# Patient Record
Sex: Female | Born: 1960 | Race: White | Hispanic: No | Smoking: Current every day smoker
Health system: Southern US, Community
[De-identification: ages and names within clinical notes are randomized; demographics above are authoritative.]

## PROBLEM LIST (undated history)

## (undated) DIAGNOSIS — R569 Unspecified convulsions: Secondary | ICD-10-CM

## (undated) DIAGNOSIS — F32A Depression, unspecified: Secondary | ICD-10-CM

## (undated) DIAGNOSIS — I10 Essential (primary) hypertension: Secondary | ICD-10-CM

## (undated) DIAGNOSIS — E119 Type 2 diabetes mellitus without complications: Secondary | ICD-10-CM

## (undated) DIAGNOSIS — F419 Anxiety disorder, unspecified: Secondary | ICD-10-CM

## (undated) DIAGNOSIS — F329 Major depressive disorder, single episode, unspecified: Secondary | ICD-10-CM

---

## 1999-09-07 ENCOUNTER — Ambulatory Visit (HOSPITAL_COMMUNITY): Admission: RE | Admit: 1999-09-07 | Discharge: 1999-09-07 | Payer: Self-pay | Admitting: Neurosurgery

## 1999-12-19 ENCOUNTER — Encounter: Admission: RE | Admit: 1999-12-19 | Discharge: 1999-12-19 | Payer: Self-pay | Admitting: Neurosurgery

## 1999-12-30 ENCOUNTER — Ambulatory Visit (HOSPITAL_COMMUNITY): Admission: RE | Admit: 1999-12-30 | Discharge: 1999-12-30 | Payer: Self-pay | Admitting: Neurosurgery

## 2000-01-04 ENCOUNTER — Ambulatory Visit (HOSPITAL_COMMUNITY): Admission: RE | Admit: 2000-01-04 | Discharge: 2000-01-04 | Payer: Self-pay | Admitting: Neurosurgery

## 2004-08-24 ENCOUNTER — Emergency Department: Payer: Self-pay | Admitting: Emergency Medicine

## 2006-03-22 ENCOUNTER — Ambulatory Visit: Payer: Self-pay | Admitting: Internal Medicine

## 2006-04-14 ENCOUNTER — Ambulatory Visit: Payer: Self-pay | Admitting: Gastroenterology

## 2006-12-31 ENCOUNTER — Emergency Department: Payer: Self-pay | Admitting: Emergency Medicine

## 2007-11-23 ENCOUNTER — Other Ambulatory Visit: Payer: Self-pay

## 2007-11-23 ENCOUNTER — Emergency Department: Payer: Self-pay | Admitting: Emergency Medicine

## 2008-03-26 ENCOUNTER — Other Ambulatory Visit: Payer: Self-pay

## 2008-03-26 ENCOUNTER — Ambulatory Visit: Payer: Self-pay | Admitting: Unknown Physician Specialty

## 2008-04-11 ENCOUNTER — Inpatient Hospital Stay: Payer: Self-pay | Admitting: Unknown Physician Specialty

## 2008-04-16 ENCOUNTER — Inpatient Hospital Stay: Payer: Self-pay | Admitting: Unknown Physician Specialty

## 2008-08-16 ENCOUNTER — Emergency Department: Payer: Self-pay | Admitting: Emergency Medicine

## 2008-08-18 ENCOUNTER — Emergency Department: Payer: Self-pay | Admitting: Unknown Physician Specialty

## 2008-10-22 ENCOUNTER — Emergency Department: Payer: Self-pay | Admitting: Emergency Medicine

## 2008-11-14 ENCOUNTER — Emergency Department: Payer: Self-pay | Admitting: Emergency Medicine

## 2008-12-01 ENCOUNTER — Inpatient Hospital Stay: Payer: Self-pay | Admitting: Internal Medicine

## 2009-04-10 ENCOUNTER — Emergency Department: Payer: Self-pay | Admitting: Emergency Medicine

## 2009-04-15 ENCOUNTER — Emergency Department (HOSPITAL_COMMUNITY): Admission: EM | Admit: 2009-04-15 | Discharge: 2009-04-16 | Payer: Self-pay | Admitting: Emergency Medicine

## 2009-05-02 ENCOUNTER — Emergency Department (HOSPITAL_COMMUNITY): Admission: EM | Admit: 2009-05-02 | Discharge: 2009-05-02 | Payer: Self-pay | Admitting: Emergency Medicine

## 2009-06-18 ENCOUNTER — Emergency Department (HOSPITAL_COMMUNITY): Admission: EM | Admit: 2009-06-18 | Discharge: 2009-06-18 | Payer: Self-pay | Admitting: Emergency Medicine

## 2009-07-26 ENCOUNTER — Inpatient Hospital Stay: Payer: Self-pay | Admitting: Internal Medicine

## 2009-09-12 ENCOUNTER — Emergency Department: Payer: Self-pay | Admitting: Emergency Medicine

## 2010-05-09 ENCOUNTER — Emergency Department: Payer: Self-pay | Admitting: Emergency Medicine

## 2010-05-28 ENCOUNTER — Emergency Department: Payer: Self-pay | Admitting: Emergency Medicine

## 2010-06-02 ENCOUNTER — Emergency Department: Payer: Self-pay | Admitting: Emergency Medicine

## 2010-06-09 ENCOUNTER — Emergency Department: Payer: Self-pay | Admitting: Emergency Medicine

## 2010-06-13 ENCOUNTER — Other Ambulatory Visit: Payer: Self-pay | Admitting: Internal Medicine

## 2010-06-16 ENCOUNTER — Ambulatory Visit: Payer: Self-pay | Admitting: Internal Medicine

## 2010-09-29 LAB — GLUCOSE, CAPILLARY

## 2010-10-02 LAB — COMPREHENSIVE METABOLIC PANEL
AST: 79 U/L — ABNORMAL HIGH (ref 0–37)
Albumin: 3.8 g/dL (ref 3.5–5.2)
BUN: 9 mg/dL (ref 6–23)
Calcium: 9.1 mg/dL (ref 8.4–10.5)
Creatinine, Ser: 0.94 mg/dL (ref 0.4–1.2)
GFR calc Af Amer: >60 mL/min (ref >60)
Total Protein: 7.5 g/dL (ref 6.0–8.3)

## 2010-10-02 LAB — URINALYSIS, ROUTINE W REFLEX MICROSCOPIC
Ketones, ur: NEGATIVE mg/dL
Leukocytes, UA: NEGATIVE
Nitrite: NEGATIVE
Protein, ur: NEGATIVE mg/dL
Urobilinogen, UA: 0.2 mg/dL (ref 0.0–1.0)
pH: 5.5 (ref 5.0–8.0)

## 2010-10-02 LAB — POCT I-STAT 3, ART BLOOD GAS (G3+)
TCO2: 24 mmol/L (ref 0–100)
pH, Arterial: 7.457 — ABNORMAL HIGH (ref 7.350–7.400)

## 2010-10-02 LAB — GLUCOSE, CAPILLARY

## 2010-10-02 LAB — DIFFERENTIAL
Basophils Relative: 0 % (ref 0–1)
Eosinophils Absolute: 0.1 10*3/uL (ref 0.0–0.7)
Eosinophils Relative: 1 % (ref 0–5)
Lymphocytes Relative: 16 % (ref 12–46)
Lymphs Abs: 1.3 10*3/uL (ref 0.7–4.0)
Monocytes Absolute: 0.7 10*3/uL (ref 0.1–1.0)
Neutro Abs: 6.5 10*3/uL (ref 1.7–7.7)

## 2010-10-02 LAB — POCT I-STAT, CHEM 8
Creatinine, Ser: 0.7 mg/dL (ref 0.4–1.2)
Hemoglobin: 15.3 g/dL — ABNORMAL HIGH (ref 12.0–15.0)
Sodium: 132 mEq/L — ABNORMAL LOW (ref 135–145)
TCO2: 23 mmol/L (ref 0–100)

## 2010-10-02 LAB — CBC
HCT: 41.9 % (ref 36.0–46.0)
MCHC: 35.3 g/dL (ref 30.0–36.0)
MCV: 100.9 fL — ABNORMAL HIGH (ref 78.0–100.0)
Platelets: 186 10*3/uL (ref 150–400)
RDW: 12.4 % (ref 11.5–15.5)
WBC: 8.6 10*3/uL (ref 4.0–10.5)

## 2010-10-02 LAB — LIPASE, BLOOD: Lipase: 320 U/L — ABNORMAL HIGH (ref 11–59)

## 2010-11-11 ENCOUNTER — Emergency Department: Payer: Self-pay | Admitting: Emergency Medicine

## 2010-11-15 ENCOUNTER — Emergency Department: Payer: Self-pay | Admitting: Emergency Medicine

## 2010-12-15 ENCOUNTER — Emergency Department: Payer: Self-pay | Admitting: Emergency Medicine

## 2010-12-19 ENCOUNTER — Emergency Department: Payer: Self-pay | Admitting: Emergency Medicine

## 2011-02-03 ENCOUNTER — Ambulatory Visit: Payer: Self-pay | Admitting: Pain Medicine

## 2011-02-05 ENCOUNTER — Ambulatory Visit: Payer: Self-pay | Admitting: Internal Medicine

## 2011-02-08 ENCOUNTER — Emergency Department: Payer: Self-pay | Admitting: Emergency Medicine

## 2011-02-11 ENCOUNTER — Emergency Department: Payer: Self-pay | Admitting: *Deleted

## 2011-02-12 ENCOUNTER — Ambulatory Visit: Payer: Self-pay | Admitting: Pain Medicine

## 2011-02-14 ENCOUNTER — Emergency Department: Payer: Self-pay | Admitting: *Deleted

## 2011-02-16 ENCOUNTER — Ambulatory Visit: Payer: Self-pay | Admitting: Pain Medicine

## 2011-02-17 ENCOUNTER — Emergency Department: Payer: Self-pay | Admitting: *Deleted

## 2011-02-23 ENCOUNTER — Ambulatory Visit: Payer: Self-pay | Admitting: Pain Medicine

## 2011-03-10 ENCOUNTER — Ambulatory Visit: Payer: Self-pay | Admitting: Pain Medicine

## 2011-03-16 ENCOUNTER — Ambulatory Visit: Payer: Self-pay | Admitting: Pain Medicine

## 2011-04-14 ENCOUNTER — Ambulatory Visit: Payer: Self-pay | Admitting: Pain Medicine

## 2011-04-22 ENCOUNTER — Ambulatory Visit: Payer: Self-pay | Admitting: Pain Medicine

## 2011-05-13 ENCOUNTER — Ambulatory Visit: Payer: Self-pay | Admitting: Internal Medicine

## 2011-06-02 ENCOUNTER — Ambulatory Visit: Payer: Self-pay | Admitting: Pain Medicine

## 2011-06-10 ENCOUNTER — Ambulatory Visit: Payer: Self-pay | Admitting: Pain Medicine

## 2011-06-25 ENCOUNTER — Emergency Department: Payer: Self-pay | Admitting: Emergency Medicine

## 2011-07-09 ENCOUNTER — Ambulatory Visit: Payer: Self-pay | Admitting: Pain Medicine

## 2011-07-15 ENCOUNTER — Ambulatory Visit: Payer: Self-pay | Admitting: Pain Medicine

## 2011-07-21 ENCOUNTER — Ambulatory Visit: Payer: Self-pay | Admitting: Pain Medicine

## 2011-07-27 ENCOUNTER — Ambulatory Visit: Payer: Self-pay | Admitting: Pain Medicine

## 2011-08-17 ENCOUNTER — Ambulatory Visit: Payer: Self-pay | Admitting: Pain Medicine

## 2011-08-20 ENCOUNTER — Ambulatory Visit: Payer: Self-pay | Admitting: Pain Medicine

## 2011-09-04 ENCOUNTER — Emergency Department: Payer: Self-pay | Admitting: Internal Medicine

## 2011-09-14 ENCOUNTER — Ambulatory Visit: Payer: Self-pay | Admitting: Pain Medicine

## 2011-10-06 ENCOUNTER — Ambulatory Visit: Payer: Self-pay | Admitting: Pain Medicine

## 2011-11-04 ENCOUNTER — Ambulatory Visit: Payer: Self-pay | Admitting: Pain Medicine

## 2011-11-11 ENCOUNTER — Ambulatory Visit: Payer: Self-pay | Admitting: Pain Medicine

## 2011-12-01 ENCOUNTER — Ambulatory Visit: Payer: Self-pay | Admitting: Pain Medicine

## 2011-12-14 ENCOUNTER — Ambulatory Visit: Payer: Self-pay | Admitting: Pain Medicine

## 2012-01-11 ENCOUNTER — Ambulatory Visit: Payer: Self-pay | Admitting: Pain Medicine

## 2012-02-11 ENCOUNTER — Ambulatory Visit: Payer: Self-pay | Admitting: Pain Medicine

## 2012-02-13 ENCOUNTER — Emergency Department: Payer: Self-pay | Admitting: Emergency Medicine

## 2012-02-22 ENCOUNTER — Ambulatory Visit: Payer: Self-pay | Admitting: Pain Medicine

## 2012-02-24 ENCOUNTER — Ambulatory Visit: Payer: Self-pay | Admitting: Pain Medicine

## 2012-02-25 ENCOUNTER — Ambulatory Visit: Payer: Self-pay | Admitting: Pain Medicine

## 2012-03-17 ENCOUNTER — Ambulatory Visit: Payer: Self-pay | Admitting: Pain Medicine

## 2012-07-10 ENCOUNTER — Encounter (HOSPITAL_COMMUNITY): Payer: Self-pay | Admitting: Emergency Medicine

## 2012-07-10 ENCOUNTER — Emergency Department (HOSPITAL_COMMUNITY): Payer: Medicaid Other

## 2012-07-10 ENCOUNTER — Emergency Department (HOSPITAL_COMMUNITY)
Admission: EM | Admit: 2012-07-10 | Discharge: 2012-07-10 | Disposition: A | Discharge status: Home / Self Care | Payer: Medicaid Other | Attending: Emergency Medicine | Admitting: Emergency Medicine

## 2012-07-10 DIAGNOSIS — R32 Unspecified urinary incontinence: Secondary | ICD-10-CM | POA: Insufficient documentation

## 2012-07-10 DIAGNOSIS — F17200 Nicotine dependence, unspecified, uncomplicated: Secondary | ICD-10-CM | POA: Insufficient documentation

## 2012-07-10 DIAGNOSIS — Z79899 Other long term (current) drug therapy: Secondary | ICD-10-CM | POA: Insufficient documentation

## 2012-07-10 DIAGNOSIS — E119 Type 2 diabetes mellitus without complications: Secondary | ICD-10-CM | POA: Insufficient documentation

## 2012-07-10 DIAGNOSIS — I10 Essential (primary) hypertension: Secondary | ICD-10-CM | POA: Insufficient documentation

## 2012-07-10 DIAGNOSIS — M545 Low back pain, unspecified: Secondary | ICD-10-CM | POA: Insufficient documentation

## 2012-07-10 DIAGNOSIS — L89309 Pressure ulcer of unspecified buttock, unspecified stage: Secondary | ICD-10-CM | POA: Insufficient documentation

## 2012-07-10 DIAGNOSIS — G8929 Other chronic pain: Secondary | ICD-10-CM | POA: Insufficient documentation

## 2012-07-10 DIAGNOSIS — F3289 Other specified depressive episodes: Secondary | ICD-10-CM | POA: Insufficient documentation

## 2012-07-10 DIAGNOSIS — R159 Full incontinence of feces: Secondary | ICD-10-CM

## 2012-07-10 DIAGNOSIS — Z7982 Long term (current) use of aspirin: Secondary | ICD-10-CM | POA: Insufficient documentation

## 2012-07-10 DIAGNOSIS — F411 Generalized anxiety disorder: Secondary | ICD-10-CM | POA: Insufficient documentation

## 2012-07-10 DIAGNOSIS — F329 Major depressive disorder, single episode, unspecified: Secondary | ICD-10-CM | POA: Insufficient documentation

## 2012-07-10 DIAGNOSIS — F1210 Cannabis abuse, uncomplicated: Secondary | ICD-10-CM | POA: Insufficient documentation

## 2012-07-10 HISTORY — DX: Depression, unspecified: F32.A

## 2012-07-10 HISTORY — DX: Anxiety disorder, unspecified: F41.9

## 2012-07-10 HISTORY — DX: Essential (primary) hypertension: I10

## 2012-07-10 HISTORY — DX: Type 2 diabetes mellitus without complications: E11.9

## 2012-07-10 HISTORY — DX: Major depressive disorder, single episode, unspecified: F32.9

## 2012-07-10 MED ORDER — OXYCODONE-ACETAMINOPHEN 5-325 MG PO TABS: ORAL_TABLET | ORAL | Status: DC

## 2012-07-10 MED ORDER — ONDANSETRON 4 MG PO TBDP
4.0000 mg | ORAL_TABLET | Freq: Once | ORAL | Status: AC
  Administered 2012-07-10: 4 mg via ORAL
  Filled 2012-07-10: qty 1

## 2012-07-10 MED ORDER — MORPHINE SULFATE 4 MG/ML IJ SOLN
4.0000 mg | Freq: Once | INTRAMUSCULAR | Status: AC
  Administered 2012-07-10: 4 mg via INTRAVENOUS
  Filled 2012-07-10: qty 1

## 2012-07-10 NOTE — ED Notes (Signed)
D/c instructions reviewed w/ pt and family - pt and family deny any further questions or concerns at present. Pt assisted to d/c via wheelchair by Erskine Squibb, EMT - pt A&Ox4 in no acute distress.

## 2012-07-10 NOTE — ED Provider Notes (Signed)
History   This chart was scribed for Wynetta Emery, PA, by Leone Payor, ED Scribe. This patient was seen in room TR06C/TR06C and the patient's care was started at 1558.   CSN: [id number]  Arrival date & time 07/10/12  1359   First MD Initiated Contact with Patient 07/10/12 1558      Chief Complaint  Patient presents with  . Back Pain     The history is provided by the patient. No language interpreter was used.    Debra Stone is a 52 y.o. female who presents to the Emergency Department complaining of chronic, ongoing severe back pain that started 2 years ago. Pt rates her current pain as 8/10.  Pt has h/o sciatica and 3 bulging discs. Pt used to go to a pain clinic but reports no longer going there because she has no relief from the pain injections. She states that change in bladder function started 6-7 months ago but states that she lost full control of bladder and bowel function starting 1 week ago. Pt denies fever, nausea, vomiting.   Pt has h/o DM, HTN, depression, anxiety. Pt is a current everyday smoker and occasional alcohol user.    Past Medical History  Diagnosis Date  . Diabetes mellitus without complication   . Hypertension   . Depression   . Anxiety     History reviewed. No pertinent past surgical history.  No family history on file.  History  Substance Use Topics  . Smoking status: Current Every Day Smoker  . Smokeless tobacco: Not on file  . Alcohol Use: Yes    No OB history provided.   Review of Systems  Constitutional: Negative for fever.  Respiratory: Negative for shortness of breath.   Cardiovascular: Negative for chest pain.  Gastrointestinal: Negative for nausea, vomiting, abdominal pain and diarrhea.  Musculoskeletal: Positive for back pain.  All other systems reviewed and are negative.    Allergies  Review of patient's allergies indicates not on file.  Home Medications  No current outpatient prescriptions on file.  BP 134/85   Pulse 87  Temp 98 F (36.7 C) (Oral)  Resp 16  SpO2 97%  Physical Exam  Nursing note and vitals reviewed. Constitutional: She is oriented to person, place, and time. She appears well-developed and well-nourished. No distress.  HENT:  Head: Normocephalic.  Mouth/Throat: Oropharynx is clear and moist.  Eyes: Conjunctivae normal and EOM are normal. Pupils are equal, round, and reactive to light.  Neck: Normal range of motion.  Cardiovascular: Normal rate, regular rhythm, normal heart sounds and intact distal pulses.   Pulmonary/Chest: Effort normal and breath sounds normal. No stridor.  Genitourinary:       External hemorrhoids, good rectal to home.  Musculoskeletal: Normal range of motion.  Neurological: She is alert and oriented to person, place, and time.       Sensation is normal.   Strength of the left lower extremity is 4/5. Patient states that this has been chronic for her.  Psychiatric: She has a normal mood and affect.    ED Course  Procedures (including critical care time)  DIAGNOSTIC STUDIES: Oxygen Saturation is 97% on room air, adequate by my interpretation.    COORDINATION OF CARE:  4:36 PM Discussed treatment plan which includes MRI with pt at bedside and pt agreed to plan.    Labs Reviewed - No data to display Mr Lumbar Spine Wo Contrast  07/10/2012  *RADIOLOGY REPORT*  Clinical Data: Low back pain.  Bladder  and bowel incontinence.  MRI LUMBAR SPINE WITHOUT CONTRAST  Technique:  Multiplanar and multiecho pulse sequences of the lumbar spine were obtained without intravenous contrast.  Comparison: Report of MRI dated 01/04/2000  Findings: Tip of the conus is at L1-2 and appears normal as does the distal thoracic spinal cord.  Normal paraspinal soft tissues.  T10-11 through L3-4:  Normal.  L4-5:  Small broad-based soft disc protrusion slightly asymmetric to the right slightly compressing the right L5 nerve root sleeve. The left L5 nerve root sleeve is also slightly  compressed.  The thecal sac terminates at the L4-5 level.  L5-S1:  Small annular tear and disc bulge central and to the right without neural impingement.  Minimal degenerative changes of the facet joints.  IMPRESSION: No acute abnormality of the lumbar spine.  Chronic degenerative disc disease at L4-5 and L5-S1.  By description on the prior report of 01/04/2000 there has been no significant change.   Original Report Authenticated By: Francene Boyers, M.D.      1. Chronic low back pain   2. Urinary and bowel incontinence   3. Pressure ulcer of buttock       MDM  Back pain and urinary and bowel incontinence. Case is discussed with attending Dr. Denton Lank. MRI ordered.   MRI shows no acute findings.  I personally performed the services described in this documentation, which was scribed in my presence. The recorded information has been reviewed and is accurate.    Pt verbalized understanding and agrees with care plan. Outpatient follow-up and return precautions given.     New Prescriptions   OXYCODONE-ACETAMINOPHEN (PERCOCET/ROXICET) 5-325 MG PER TABLET    1 to 2 tabs PO q6hrs  PRN for pain     Wynetta Emery, PA-C 07/11/12 1007

## 2012-07-10 NOTE — ED Notes (Signed)
Pt presents w/ chronic lower back pain, worse tonight and seeking pain management assistance. Pt in no acute distress, denies injuries and moves all extremities with out difficulty. Sandwich bag and soda given per pt request.

## 2012-07-10 NOTE — ED Notes (Signed)
Pt. Stated, My back pain is so bad i can't control it.  I can't control my bowels and urine to the point of wearing pull-ups.  Pt. Crying at triage wanting someone to help her feel better.

## 2012-07-10 NOTE — ED Notes (Signed)
Pt transported to MRI 

## 2012-07-10 NOTE — ED Notes (Signed)
Patient transported to MRI 

## 2012-07-10 NOTE — ED Notes (Addendum)
IV removed from rt ac. Bleeding controled 2x2 applied with tape.

## 2012-07-11 NOTE — ED Provider Notes (Signed)
Medical screening examination/treatment/procedure(s) were performed by non-physician practitioner and as supervising physician I was immediately available for consultation/collaboration.   Suzi Roots, MD 07/11/12 [phone]

## 2012-08-27 ENCOUNTER — Emergency Department: Payer: Self-pay | Admitting: Emergency Medicine

## 2012-08-27 LAB — URINALYSIS, COMPLETE
Bilirubin,UR: NEGATIVE
Blood: NEGATIVE
Nitrite: POSITIVE
Protein: 30
RBC,UR: NONE SEEN /HPF (ref 0–5)
Squamous Epithelial: 11
WBC UR: 21 /HPF (ref 0–5)

## 2012-08-27 LAB — DRUG SCREEN, URINE
Barbiturates, Ur Screen: NEGATIVE (ref ?–200)
Cocaine Metabolite,Ur ~~LOC~~: NEGATIVE (ref ?–300)
MDMA (Ecstasy)Ur Screen: NEGATIVE (ref ?–500)
Methadone, Ur Screen: NEGATIVE (ref ?–300)
Phencyclidine (PCP) Ur S: NEGATIVE (ref ?–25)

## 2012-08-27 LAB — COMPREHENSIVE METABOLIC PANEL
Bilirubin,Total: 0.4 mg/dL (ref 0.2–1.0)
Co2: 29 mmol/L (ref 21–32)
Creatinine: 0.96 mg/dL (ref 0.60–1.30)
SGOT(AST): 24 U/L (ref 15–37)
SGPT (ALT): 18 U/L (ref 12–78)
Total Protein: 7.8 g/dL (ref 6.4–8.2)

## 2012-08-27 LAB — CBC
Platelet: 243 10*3/uL (ref 150–440)
WBC: 10.5 10*3/uL (ref 3.6–11.0)

## 2012-08-27 LAB — ETHANOL: Ethanol %: <0.003 % (ref 0.000–0.080)

## 2012-09-25 ENCOUNTER — Emergency Department (HOSPITAL_COMMUNITY)
Admission: EM | Admit: 2012-09-25 | Discharge: 2012-09-25 | Disposition: A | Discharge status: Home / Self Care | Payer: Medicaid Other | Attending: Emergency Medicine | Admitting: Emergency Medicine

## 2012-09-25 ENCOUNTER — Emergency Department (HOSPITAL_COMMUNITY): Payer: Medicaid Other

## 2012-09-25 ENCOUNTER — Encounter (HOSPITAL_COMMUNITY): Payer: Self-pay | Admitting: *Deleted

## 2012-09-25 DIAGNOSIS — R892 Abnormal level of other drugs, medicaments and biological substances in specimens from other organs, systems and tissues: Secondary | ICD-10-CM | POA: Insufficient documentation

## 2012-09-25 DIAGNOSIS — E119 Type 2 diabetes mellitus without complications: Secondary | ICD-10-CM | POA: Insufficient documentation

## 2012-09-25 DIAGNOSIS — Y9389 Activity, other specified: Secondary | ICD-10-CM | POA: Insufficient documentation

## 2012-09-25 DIAGNOSIS — Y929 Unspecified place or not applicable: Secondary | ICD-10-CM | POA: Insufficient documentation

## 2012-09-25 DIAGNOSIS — G40909 Epilepsy, unspecified, not intractable, without status epilepticus: Secondary | ICD-10-CM | POA: Insufficient documentation

## 2012-09-25 DIAGNOSIS — IMO0002 Reserved for concepts with insufficient information to code with codable children: Secondary | ICD-10-CM | POA: Insufficient documentation

## 2012-09-25 DIAGNOSIS — F17200 Nicotine dependence, unspecified, uncomplicated: Secondary | ICD-10-CM | POA: Insufficient documentation

## 2012-09-25 DIAGNOSIS — S20212A Contusion of left front wall of thorax, initial encounter: Secondary | ICD-10-CM

## 2012-09-25 DIAGNOSIS — F411 Generalized anxiety disorder: Secondary | ICD-10-CM | POA: Insufficient documentation

## 2012-09-25 DIAGNOSIS — S40012A Contusion of left shoulder, initial encounter: Secondary | ICD-10-CM

## 2012-09-25 DIAGNOSIS — F329 Major depressive disorder, single episode, unspecified: Secondary | ICD-10-CM | POA: Insufficient documentation

## 2012-09-25 DIAGNOSIS — Z79899 Other long term (current) drug therapy: Secondary | ICD-10-CM | POA: Insufficient documentation

## 2012-09-25 DIAGNOSIS — R569 Unspecified convulsions: Secondary | ICD-10-CM

## 2012-09-25 DIAGNOSIS — S40019A Contusion of unspecified shoulder, initial encounter: Secondary | ICD-10-CM | POA: Insufficient documentation

## 2012-09-25 DIAGNOSIS — R7889 Finding of other specified substances, not normally found in blood: Secondary | ICD-10-CM

## 2012-09-25 DIAGNOSIS — R296 Repeated falls: Secondary | ICD-10-CM | POA: Insufficient documentation

## 2012-09-25 DIAGNOSIS — S20219A Contusion of unspecified front wall of thorax, initial encounter: Secondary | ICD-10-CM | POA: Insufficient documentation

## 2012-09-25 DIAGNOSIS — I10 Essential (primary) hypertension: Secondary | ICD-10-CM | POA: Insufficient documentation

## 2012-09-25 DIAGNOSIS — F3289 Other specified depressive episodes: Secondary | ICD-10-CM | POA: Insufficient documentation

## 2012-09-25 HISTORY — DX: Unspecified convulsions: R56.9

## 2012-09-25 LAB — GLUCOSE, CAPILLARY: Glucose-Capillary: 143 mg/dL — ABNORMAL HIGH (ref 70–99)

## 2012-09-25 LAB — COMPREHENSIVE METABOLIC PANEL
BUN: 11 mg/dL (ref 6–23)
CO2: 26 mEq/L (ref 19–32)
Calcium: 8.8 mg/dL (ref 8.4–10.5)
Chloride: 100 mEq/L (ref 96–112)
Creatinine, Ser: 0.88 mg/dL (ref 0.50–1.10)
GFR calc Af Amer: 87 mL/min — ABNORMAL LOW (ref >90)
GFR calc non Af Amer: 75 mL/min — ABNORMAL LOW (ref >90)
Glucose, Bld: 145 mg/dL — ABNORMAL HIGH (ref 70–99)
Total Bilirubin: 0.2 mg/dL — ABNORMAL LOW (ref 0.3–1.2)

## 2012-09-25 LAB — CBC
Hemoglobin: 13.5 g/dL (ref 12.0–15.0)
Platelets: 237 10*3/uL (ref 150–400)
RBC: 4.51 MIL/uL (ref 3.87–5.11)
WBC: 11.4 10*3/uL — ABNORMAL HIGH (ref 4.0–10.5)

## 2012-09-25 MED ORDER — PHENYTOIN SODIUM EXTENDED 100 MG PO CAPS: 300.0000 mg | ORAL_CAPSULE | Freq: Every day | ORAL | Status: AC

## 2012-09-25 MED ORDER — PHENYTOIN SODIUM EXTENDED 100 MG PO CAPS
300.0000 mg | ORAL_CAPSULE | Freq: Once | ORAL | Status: AC
  Administered 2012-09-25: 300 mg via ORAL
  Filled 2012-09-25: qty 3

## 2012-09-25 MED ORDER — OXYCODONE-ACETAMINOPHEN 5-325 MG PO TABS
2.0000 | ORAL_TABLET | Freq: Once | ORAL | Status: AC
  Administered 2012-09-25: 2 via ORAL
  Filled 2012-09-25: qty 2

## 2012-09-25 NOTE — ED Provider Notes (Signed)
History     CSN: [id number]  Arrival date & time 09/25/12  1220   First MD Initiated Contact with Patient 09/25/12 1337      Chief Complaint  Patient presents with  . Seizures    The history is provided by the patient.   the patient reports a history of epilepsy.  She's only on 100 mg of Dilantin daily.  She presents today complaining of severe left shoulder and chest pain after a seizure yesterday.  She reports that she had a typical seizure yesterday and fell to her left side.  She denies headache or neck pain.  She reports pain in her left shoulder with range of motion.  She also has pain in her left chest with palpation.  No shortness of breath.  No abdominal pain.  No nausea vomiting or diarrhea.  She's been compliant with her medications.  No fevers or chills.  No other complaints.  Her neurologist is in [facility].  Past Medical History  Diagnosis Date  . Diabetes mellitus without complication   . Hypertension   . Depression   . Anxiety   . Seizures     History reviewed. No pertinent past surgical history.  History reviewed. No pertinent family history.  History  Substance Use Topics  . Smoking status: Current Every Day Smoker  . Smokeless tobacco: Not on file  . Alcohol Use: Yes    OB History   Grav Para Term Preterm Abortions TAB SAB Ect Mult Living                  Review of Systems  Neurological: Positive for seizures.  All other systems reviewed and are negative.    Allergies  Vicodin  Home Medications   Current Outpatient Rx  Name  Route  Sig  Dispense  Refill  . albuterol (PROVENTIL HFA;VENTOLIN HFA) 108 (90 BASE) MCG/ACT inhaler   Inhalation   Inhale 1-2 puffs into the lungs every 4 (four) hours as needed for wheezing.         Marland Kitchen amLODipine (NORVASC) 10 MG tablet   Oral   Take 10 mg by mouth daily.         Marland Kitchen atenolol (TENORMIN) 100 MG tablet   Oral   Take 100 mg by mouth daily.         . budesonide-formoterol  (SYMBICORT) 160-4.5 MCG/ACT inhaler   Inhalation   Inhale 2 puffs into the lungs 2 (two) times daily.         . cholecalciferol (VITAMIN D) 1000 UNITS tablet   Oral   Take 2,000 Units by mouth daily.         . clonazePAM (KLONOPIN) 1 MG tablet   Oral   Take 1 mg by mouth 3 (three) times daily as needed for anxiety (nerves and anger outburst).         . cloNIDine (CATAPRES) 0.1 MG tablet   Oral   Take 0.1 mg by mouth 2 (two) times daily as needed. For hot flashes         . cyclobenzaprine (FLEXERIL) 10 MG tablet   Oral   Take 10 mg by mouth every 8 (eight) hours as needed. For muscle spasms         . DULoxetine (CYMBALTA) 30 MG capsule   Oral   Take 30 mg by mouth daily. For anxiety, depression         . Fluticasone-Salmeterol (ADVAIR) 250-50 MCG/DOSE AEPB   Inhalation  Inhale 2 puffs into the lungs daily.         Marland Kitchen lisinopril (PRINIVIL,ZESTRIL) 20 MG tablet   Oral   Take 40 mg by mouth daily.         . meclizine (ANTIVERT) 25 MG tablet   Oral   Take 25 mg by mouth 3 (three) times daily as needed for dizziness.         . metFORMIN (GLUCOPHAGE) 500 MG tablet   Oral   Take 1,000 mg by mouth 2 (two) times daily with a meal.         . omeprazole (PRILOSEC) 20 MG capsule   Oral   Take 20 mg by mouth daily.         . pravastatin (PRAVACHOL) 40 MG tablet   Oral   Take 40 mg by mouth daily.         . pregabalin (LYRICA) 100 MG capsule   Oral   Take 100 mg by mouth 2 (two) times daily.         Marland Kitchen rOPINIRole (REQUIP) 1 MG tablet   Oral   Take 1 mg by mouth at bedtime.         . saxagliptin HCl (ONGLYZA) 2.5 MG TABS tablet   Oral   Take 2.5 mg by mouth daily.         . simvastatin (ZOCOR) 20 MG tablet   Oral   Take 20 mg by mouth daily.         . phenytoin (DILANTIN) 100 MG ER capsule   Oral   Take 3 capsules (300 mg total) by mouth daily.   90 capsule   0     BP 158/82  Pulse 73  Temp(Src) 98.1 F (36.7 C) (Oral)  Resp  19  Ht 6' (1.829 m)  Wt 248 lb (112.492 kg)  BMI 33.63 kg/m2  SpO2 99%  Physical Exam  Nursing note and vitals reviewed. Constitutional: She is oriented to person, place, and time. She appears well-developed and well-nourished. No distress.  HENT:  Head: Normocephalic and atraumatic.  Eyes: EOM are normal. Pupils are equal, round, and reactive to light.  Neck: Normal range of motion.  Cardiovascular: Normal rate, regular rhythm and normal heart sounds.   Pulmonary/Chest: Effort normal and breath sounds normal.  Mild tenderness of left lateral chest wall.  Abdominal: Soft. She exhibits no distension. There is no tenderness.  Musculoskeletal: Normal range of motion.  Mild tenderness of left lateral deltoid without deformity.  Full range of motion of left shoulder.  Normal pulses in left radial artery  Neurological: She is alert and oriented to person, place, and time.  5/5 strength in major muscle groups of  bilateral upper and lower extremities. Speech normal. No facial asymetry.   Skin: Skin is warm and dry.  Psychiatric: She has a normal mood and affect. Judgment normal.    ED Course  Procedures (including critical care time)  Labs Reviewed  PHENYTOIN LEVEL, TOTAL - Abnormal; Notable for the following:    Phenytoin Lvl 3.8 (*)    All other components within normal limits  COMPREHENSIVE METABOLIC PANEL - Abnormal; Notable for the following:    Glucose, Bld 145 (*)    Total Bilirubin 0.2 (*)    GFR calc non Af Amer 75 (*)    GFR calc Af Amer 87 (*)    All other components within normal limits  CBC - Abnormal; Notable for the following:    WBC 11.4 (*)  All other components within normal limits  GLUCOSE, CAPILLARY - Abnormal; Notable for the following:    Glucose-Capillary 143 (*)    All other components within normal limits   Dg Chest 2 View  09/25/2012  *RADIOLOGY REPORT*  Clinical Data: Possible seizure  CHEST - 2 VIEW  Comparison: 06/18/2009  Findings: Normal heart  size and vascularity.  Lungs remain clear. No focal pneumonia, edema, effusion or pneumothorax.  Trachea midline.  Coronary stents noted.  Prior cholecystectomy evident.  IMPRESSION: No acute chest finding   Original Report Authenticated By: Judie Petit. Miles Costain, M.D.    Dg Shoulder Left  09/25/2012  *RADIOLOGY REPORT*  Clinical Data: Seizure, pain  LEFT SHOULDER - 2+ VIEW  Comparison: 09/25/2012  Findings: Normal alignment without fracture.  AC joint aligned as well without separation.  No acute osseous or joint abnormality. Left upper lobe clear.  Ribs appear intact.  IMPRESSION: No acute finding   Original Report Authenticated By: Judie Petit. Miles Costain, M.D.    I personally reviewed the imaging tests through PACS system I reviewed available ER/hospitalization records through the EMR   1. Seizure   2. Subtherapeutic serum dilantin level   3. Contusion of chest, left, initial encounter   4. Contusion of left shoulder, initial encounter       MDM  3:44 PM Basophils much better after pain medicine.  Seizure secondary to subtherapeutic Dilantin level.  Patient's been taking 100 mg daily.  We'll increase her to 300 mg daily.  Close neurology followup.  Instructions return to ER for new or worsening symptoms.  Chest and left shoulder contusions without radiographic abnormalities.  She has tramadol at home for pain.        Lyanne Co, MD 09/25/12 [phone]

## 2012-09-25 NOTE — ED Notes (Signed)
Pt states that in March she had a stroke and 3 seizures. Pt states she has been taking medicine but has had 2 seizures since. Pt states she came to ER because she hurt her left shoulder. No deformity, pt has full ROM.

## 2012-09-25 NOTE — ED Notes (Signed)
Pt reports having hx of seizures, had a witnessed seizure yesterday, reports taking her meds as prescribed. Reports falling during her seizure activity yesterday and now having left shoulder pain, no distress noted at triage.

## 2012-11-10 ENCOUNTER — Emergency Department: Payer: Self-pay | Admitting: Emergency Medicine

## 2012-11-10 LAB — URINALYSIS, COMPLETE
Bacteria: NONE SEEN
Bilirubin,UR: NEGATIVE
Blood: NEGATIVE
Glucose,UR: NEGATIVE mg/dL (ref 0–75)
Nitrite: NEGATIVE
WBC UR: 2 /HPF (ref 0–5)

## 2012-11-10 LAB — COMPREHENSIVE METABOLIC PANEL WITH GFR
Albumin: 3.7 g/dL
Alkaline Phosphatase: 108 U/L
Anion Gap: 6 — ABNORMAL LOW
BUN: 12 mg/dL
Bilirubin,Total: 0.3 mg/dL
Calcium, Total: 9 mg/dL
Chloride: 110 mmol/L — ABNORMAL HIGH
Co2: 25 mmol/L
Creatinine: 1.14 mg/dL
EGFR (African American): >60
EGFR (Non-African Amer.): 56 — ABNORMAL LOW
Glucose: 178 mg/dL — ABNORMAL HIGH
Osmolality: 285
Potassium: 4.1 mmol/L
SGOT(AST): 28 U/L
SGPT (ALT): 19 U/L
Sodium: 141 mmol/L
Total Protein: 7.7 g/dL

## 2012-11-10 LAB — CBC
HCT: 38.2 %
HGB: 13.3 g/dL
MCH: 32.6 pg
MCHC: 34.8 g/dL
MCV: 94 fL
Platelet: 292 x10 3/mm 3
RBC: 4.08 X10 6/mm 3
RDW: 15.6 % — ABNORMAL HIGH
WBC: 10.7 x10 3/mm 3

## 2012-11-10 LAB — ACETAMINOPHEN LEVEL: Acetaminophen: 3 ug/mL — ABNORMAL LOW

## 2012-11-10 LAB — DRUG SCREEN, URINE
Barbiturates, Ur Screen: POSITIVE (ref ?–200)
Cannabinoid 50 Ng, Ur ~~LOC~~: NEGATIVE (ref ?–50)
MDMA (Ecstasy)Ur Screen: NEGATIVE (ref ?–500)
Methadone, Ur Screen: NEGATIVE (ref ?–300)
Opiate, Ur Screen: NEGATIVE (ref ?–300)
Phencyclidine (PCP) Ur S: NEGATIVE (ref ?–25)

## 2012-11-10 LAB — ETHANOL
Ethanol %: <0.003 % (ref 0.000–0.080)
Ethanol: <3 mg/dL

## 2012-11-10 LAB — TSH: Thyroid Stimulating Horm: 1.53 u[IU]/mL

## 2012-11-12 ENCOUNTER — Ambulatory Visit: Payer: Self-pay | Admitting: Neurology

## 2012-11-12 LAB — CREATININE, SERUM
Creatinine: 1.11 mg/dL (ref 0.60–1.30)
EGFR (African American): >60
EGFR (Non-African Amer.): 58 — ABNORMAL LOW

## 2013-02-07 ENCOUNTER — Inpatient Hospital Stay: Payer: Self-pay | Admitting: Psychiatry

## 2013-02-07 LAB — COMPREHENSIVE METABOLIC PANEL
Alkaline Phosphatase: 100 U/L (ref 50–136)
Co2: 23 mmol/L (ref 21–32)
Creatinine: 1.18 mg/dL (ref 0.60–1.30)
EGFR (African American): >60
EGFR (Non-African Amer.): 54 — ABNORMAL LOW
Glucose: 180 mg/dL — ABNORMAL HIGH (ref 65–99)
Potassium: 4.5 mmol/L (ref 3.5–5.1)
Total Protein: 7.9 g/dL (ref 6.4–8.2)

## 2013-02-07 LAB — CBC
MCV: 93 fL (ref 80–100)
RDW: 13.7 % (ref 11.5–14.5)
WBC: 11.2 10*3/uL — ABNORMAL HIGH (ref 3.6–11.0)

## 2013-02-07 LAB — URINALYSIS, COMPLETE
Bilirubin,UR: NEGATIVE
Blood: NEGATIVE
Glucose,UR: 50 mg/dL (ref 0–75)
Leukocyte Esterase: NEGATIVE
Ph: 5 (ref 4.5–8.0)
RBC,UR: NONE SEEN /HPF (ref 0–5)
WBC UR: 1 /HPF (ref 0–5)

## 2013-02-07 LAB — SALICYLATE LEVEL: Salicylates, Serum: 3.3 mg/dL — ABNORMAL HIGH

## 2013-02-07 LAB — ETHANOL: Ethanol %: <0.003 % (ref 0.000–0.080)

## 2013-02-07 LAB — DRUG SCREEN, URINE
Barbiturates, Ur Screen: POSITIVE (ref ?–200)
Cannabinoid 50 Ng, Ur ~~LOC~~: NEGATIVE (ref ?–50)
MDMA (Ecstasy)Ur Screen: NEGATIVE (ref ?–500)
Methadone, Ur Screen: NEGATIVE (ref ?–300)
Opiate, Ur Screen: NEGATIVE (ref ?–300)

## 2013-02-07 LAB — PHENYTOIN LEVEL, TOTAL: Dilantin: 1.9 ug/mL — ABNORMAL LOW (ref 10.0–20.0)

## 2013-02-07 LAB — ACETAMINOPHEN LEVEL: Acetaminophen: <2 ug/mL

## 2013-05-23 ENCOUNTER — Emergency Department: Payer: Self-pay | Admitting: Emergency Medicine

## 2013-05-23 LAB — DRUG SCREEN, URINE
Barbiturates, Ur Screen: POSITIVE (ref ?–200)
Cannabinoid 50 Ng, Ur ~~LOC~~: NEGATIVE (ref ?–50)
Tricyclic, Ur Screen: NEGATIVE (ref ?–1000)

## 2013-05-23 LAB — COMPREHENSIVE METABOLIC PANEL
Albumin: 3.9 g/dL (ref 3.4–5.0)
Anion Gap: 6 — ABNORMAL LOW (ref 7–16)
Chloride: 109 mmol/L — ABNORMAL HIGH (ref 98–107)
Co2: 22 mmol/L (ref 21–32)
Creatinine: 1.39 mg/dL — ABNORMAL HIGH (ref 0.60–1.30)
EGFR (African American): 51 — ABNORMAL LOW
Glucose: 168 mg/dL — ABNORMAL HIGH (ref 65–99)
SGOT(AST): 34 U/L (ref 15–37)

## 2013-05-23 LAB — URINALYSIS, COMPLETE
Bilirubin,UR: NEGATIVE
Blood: NEGATIVE
Glucose,UR: NEGATIVE mg/dL (ref 0–75)
Ketone: NEGATIVE
Ph: 5 (ref 4.5–8.0)
Protein: NEGATIVE
RBC,UR: NONE SEEN /HPF (ref 0–5)

## 2013-05-23 LAB — CBC
MCV: 92 fL (ref 80–100)
RBC: 4.2 10*6/uL (ref 3.80–5.20)
RDW: 14.5 % (ref 11.5–14.5)

## 2013-05-23 LAB — ETHANOL: Ethanol: <3 mg/dL

## 2013-08-13 ENCOUNTER — Emergency Department: Payer: Self-pay | Admitting: Emergency Medicine

## 2013-08-13 LAB — BASIC METABOLIC PANEL
Anion Gap: 14 (ref 7–16)
BUN: 12 mg/dL (ref 7–18)
CREATININE: 0.89 mg/dL (ref 0.60–1.30)
Calcium, Total: 8.9 mg/dL (ref 8.5–10.1)
Chloride: 110 mmol/L — ABNORMAL HIGH (ref 98–107)
Co2: 15 mmol/L — ABNORMAL LOW (ref 21–32)
EGFR (African American): >60
EGFR (Non-African Amer.): >60
GLUCOSE: 242 mg/dL — AB (ref 65–99)
Osmolality: 285 (ref 275–301)
POTASSIUM: 3.6 mmol/L (ref 3.5–5.1)
Sodium: 139 mmol/L (ref 136–145)

## 2013-08-13 LAB — CBC
HCT: 40 % (ref 35.0–47.0)
HGB: 13.1 g/dL (ref 12.0–16.0)
MCH: 30.7 pg (ref 26.0–34.0)
MCHC: 32.6 g/dL (ref 32.0–36.0)
MCV: 94 fL (ref 80–100)
Platelet: 281 10*3/uL (ref 150–440)
RBC: 4.26 10*6/uL (ref 3.80–5.20)
RDW: 13.6 % (ref 11.5–14.5)
WBC: 9.8 10*3/uL (ref 3.6–11.0)

## 2013-08-13 LAB — ETHANOL
ETHANOL LVL: 112 mg/dL
Ethanol %: 0.112 % — ABNORMAL HIGH (ref 0.000–0.080)

## 2013-08-13 LAB — TROPONIN I

## 2013-12-31 ENCOUNTER — Emergency Department: Payer: Self-pay | Admitting: Emergency Medicine

## 2013-12-31 LAB — URINALYSIS, COMPLETE
BLOOD: NEGATIVE
Bilirubin,UR: NEGATIVE
GLUCOSE, UR: NEGATIVE mg/dL (ref 0–75)
KETONE: NEGATIVE
LEUKOCYTE ESTERASE: NEGATIVE
Nitrite: NEGATIVE
PH: 5 (ref 4.5–8.0)
Protein: NEGATIVE
Specific Gravity: 1.008 (ref 1.003–1.030)

## 2013-12-31 LAB — COMPREHENSIVE METABOLIC PANEL
ALBUMIN: 3.3 g/dL — AB (ref 3.4–5.0)
ALK PHOS: 131 U/L — AB
Anion Gap: 10 (ref 7–16)
BUN: 28 mg/dL — ABNORMAL HIGH (ref 7–18)
Bilirubin,Total: 0.2 mg/dL (ref 0.2–1.0)
CO2: 20 mmol/L — AB (ref 21–32)
Calcium, Total: 7.8 mg/dL — ABNORMAL LOW (ref 8.5–10.1)
Chloride: 98 mmol/L (ref 98–107)
Creatinine: 1.34 mg/dL — ABNORMAL HIGH (ref 0.60–1.30)
EGFR (African American): 53 — ABNORMAL LOW
GFR CALC NON AF AMER: 46 — AB
Glucose: 119 mg/dL — ABNORMAL HIGH (ref 65–99)
Osmolality: 264 (ref 275–301)
Potassium: 4.6 mmol/L (ref 3.5–5.1)
SGOT(AST): 41 U/L — ABNORMAL HIGH (ref 15–37)
SGPT (ALT): 20 U/L (ref 12–78)
Sodium: 128 mmol/L — ABNORMAL LOW (ref 136–145)
Total Protein: 7.3 g/dL (ref 6.4–8.2)

## 2013-12-31 LAB — DRUG SCREEN, URINE
AMPHETAMINES, UR SCREEN: NEGATIVE (ref ?–1000)
Barbiturates, Ur Screen: POSITIVE (ref ?–200)
Benzodiazepine, Ur Scrn: POSITIVE (ref ?–200)
CANNABINOID 50 NG, UR ~~LOC~~: NEGATIVE (ref ?–50)
Cocaine Metabolite,Ur ~~LOC~~: NEGATIVE (ref ?–300)
MDMA (Ecstasy)Ur Screen: NEGATIVE (ref ?–500)
METHADONE, UR SCREEN: NEGATIVE (ref ?–300)
Opiate, Ur Screen: NEGATIVE (ref ?–300)
Phencyclidine (PCP) Ur S: NEGATIVE (ref ?–25)
Tricyclic, Ur Screen: NEGATIVE (ref ?–1000)

## 2013-12-31 LAB — TROPONIN I: Troponin-I: <0.02 ng/mL

## 2013-12-31 LAB — CBC WITH DIFFERENTIAL/PLATELET
Basophil #: 0.1 10*3/uL (ref 0.0–0.1)
Basophil %: 0.3 %
Eosinophil #: 0.2 10*3/uL (ref 0.0–0.7)
Eosinophil %: 1 %
HCT: 36.6 % (ref 35.0–47.0)
HGB: 12.1 g/dL (ref 12.0–16.0)
Lymphocyte #: 2.8 10*3/uL (ref 1.0–3.6)
Lymphocyte %: 16.2 %
MCH: 31 pg (ref 26.0–34.0)
MCHC: 33 g/dL (ref 32.0–36.0)
MCV: 94 fL (ref 80–100)
Monocyte #: 1 x10 3/mm — ABNORMAL HIGH (ref 0.2–0.9)
Monocyte %: 5.7 %
Neutrophil #: 13.3 10*3/uL — ABNORMAL HIGH (ref 1.4–6.5)
Neutrophil %: 76.8 %
Platelet: 255 10*3/uL (ref 150–440)
RBC: 3.9 10*6/uL (ref 3.80–5.20)
RDW: 14 % (ref 11.5–14.5)
WBC: 17.3 10*3/uL — ABNORMAL HIGH (ref 3.6–11.0)

## 2014-01-12 ENCOUNTER — Inpatient Hospital Stay: Payer: Self-pay | Admitting: Internal Medicine

## 2014-01-12 LAB — CBC WITH DIFFERENTIAL/PLATELET
BASOS ABS: 0.2 10*3/uL — AB (ref 0.0–0.1)
BASOS PCT: 0.8 %
EOS PCT: 0.9 %
Eosinophil #: 0.2 10*3/uL (ref 0.0–0.7)
HCT: 50.9 % — ABNORMAL HIGH (ref 35.0–47.0)
HGB: 16.3 g/dL — ABNORMAL HIGH (ref 12.0–16.0)
LYMPHS ABS: 5.8 10*3/uL — AB (ref 1.0–3.6)
Lymphocyte %: 22.7 %
MCH: 31 pg (ref 26.0–34.0)
MCHC: 32 g/dL (ref 32.0–36.0)
MCV: 97 fL (ref 80–100)
Monocyte #: 0.9 x10 3/mm (ref 0.2–0.9)
Monocyte %: 3.7 %
NEUTROS ABS: 18.3 10*3/uL — AB (ref 1.4–6.5)
Neutrophil %: 71.9 %
Platelet: 386 10*3/uL (ref 150–440)
RBC: 5.24 10*6/uL — ABNORMAL HIGH (ref 3.80–5.20)
RDW: 14.3 % (ref 11.5–14.5)
WBC: 25.5 10*3/uL — ABNORMAL HIGH (ref 3.6–11.0)

## 2014-01-12 LAB — PROTIME-INR
INR: 1.5
Prothrombin Time: 17.8 secs — ABNORMAL HIGH (ref 11.5–14.7)

## 2014-01-12 LAB — COMPREHENSIVE METABOLIC PANEL
ALK PHOS: 133 U/L — AB
ANION GAP: 14 (ref 7–16)
Albumin: 3.8 g/dL (ref 3.4–5.0)
BUN: 19 mg/dL — ABNORMAL HIGH (ref 7–18)
Bilirubin,Total: 0.4 mg/dL (ref 0.2–1.0)
CALCIUM: 9.5 mg/dL (ref 8.5–10.1)
Chloride: 102 mmol/L (ref 98–107)
Co2: 17 mmol/L — ABNORMAL LOW (ref 21–32)
Creatinine: 1.69 mg/dL — ABNORMAL HIGH (ref 0.60–1.30)
EGFR (African American): 40 — ABNORMAL LOW
GFR CALC NON AF AMER: 35 — AB
Glucose: 229 mg/dL — ABNORMAL HIGH (ref 65–99)
Osmolality: 276 (ref 275–301)
POTASSIUM: 3.2 mmol/L — AB (ref 3.5–5.1)
SGOT(AST): 50 U/L — ABNORMAL HIGH (ref 15–37)
SGPT (ALT): 22 U/L (ref 12–78)
Sodium: 133 mmol/L — ABNORMAL LOW (ref 136–145)
TOTAL PROTEIN: 8.3 g/dL — AB (ref 6.4–8.2)

## 2014-01-12 LAB — CK TOTAL AND CKMB (NOT AT ARMC)
CK, TOTAL: 171 U/L
CK-MB: 2.5 ng/mL (ref 0.5–3.6)

## 2014-01-12 LAB — TROPONIN I: Troponin-I: <0.02 ng/mL

## 2014-01-12 LAB — LIPASE, BLOOD: Lipase: 415 U/L — ABNORMAL HIGH (ref 73–393)

## 2014-01-13 LAB — CBC WITH DIFFERENTIAL/PLATELET
BASOS PCT: 0.5 %
Basophil #: 0.1 10*3/uL (ref 0.0–0.1)
Eosinophil #: 0 10*3/uL (ref 0.0–0.7)
Eosinophil %: 0.1 %
HCT: 42.8 % (ref 35.0–47.0)
HGB: 14 g/dL (ref 12.0–16.0)
Lymphocyte #: 1.2 10*3/uL (ref 1.0–3.6)
Lymphocyte %: 6.7 %
MCH: 30.6 pg (ref 26.0–34.0)
MCHC: 32.8 g/dL (ref 32.0–36.0)
MCV: 93 fL (ref 80–100)
MONOS PCT: 3.9 %
Monocyte #: 0.7 x10 3/mm (ref 0.2–0.9)
NEUTROS ABS: 16.4 10*3/uL — AB (ref 1.4–6.5)
Neutrophil %: 88.8 %
PLATELETS: 229 10*3/uL (ref 150–440)
RBC: 4.59 10*6/uL (ref 3.80–5.20)
RDW: 14.6 % — ABNORMAL HIGH (ref 11.5–14.5)
WBC: 18.5 10*3/uL — ABNORMAL HIGH (ref 3.6–11.0)

## 2014-01-13 LAB — URINALYSIS, COMPLETE
BILIRUBIN, UR: NEGATIVE
BLOOD: NEGATIVE
Bacteria: NONE SEEN
Glucose,UR: NEGATIVE mg/dL (ref 0–75)
Ketone: NEGATIVE
Leukocyte Esterase: NEGATIVE
Nitrite: NEGATIVE
PH: 5 (ref 4.5–8.0)
Protein: NEGATIVE
Specific Gravity: 1.015 (ref 1.003–1.030)
Squamous Epithelial: NONE SEEN

## 2014-01-13 LAB — DRUG SCREEN, URINE
Amphetamines, Ur Screen: NEGATIVE (ref ?–1000)
BARBITURATES, UR SCREEN: POSITIVE (ref ?–200)
Benzodiazepine, Ur Scrn: POSITIVE (ref ?–200)
CANNABINOID 50 NG, UR ~~LOC~~: NEGATIVE (ref ?–50)
Cocaine Metabolite,Ur ~~LOC~~: NEGATIVE (ref ?–300)
MDMA (Ecstasy)Ur Screen: POSITIVE (ref ?–500)
Methadone, Ur Screen: NEGATIVE (ref ?–300)
OPIATE, UR SCREEN: POSITIVE (ref ?–300)
PHENCYCLIDINE (PCP) UR S: NEGATIVE (ref ?–25)
TRICYCLIC, UR SCREEN: POSITIVE (ref ?–1000)

## 2014-01-13 LAB — BASIC METABOLIC PANEL
Anion Gap: 11 (ref 7–16)
BUN: 22 mg/dL — AB (ref 7–18)
CHLORIDE: 112 mmol/L — AB (ref 98–107)
CO2: 16 mmol/L — AB (ref 21–32)
CREATININE: 1.54 mg/dL — AB (ref 0.60–1.30)
Calcium, Total: 7 mg/dL — CL (ref 8.5–10.1)
EGFR (Non-African Amer.): 39 — ABNORMAL LOW
GFR CALC AF AMER: 45 — AB
Glucose: 162 mg/dL — ABNORMAL HIGH (ref 65–99)
OSMOLALITY: 284 (ref 275–301)
Potassium: 4.8 mmol/L (ref 3.5–5.1)
Sodium: 139 mmol/L (ref 136–145)

## 2014-01-13 LAB — HEMOGLOBIN
HGB: 14.2 g/dL (ref 12.0–16.0)
HGB: 12.5 g/dL (ref 12.0–16.0)

## 2014-01-13 LAB — CLOSTRIDIUM DIFFICILE(ARMC)

## 2014-01-14 LAB — BASIC METABOLIC PANEL
Anion Gap: 11 (ref 7–16)
BUN: 12 mg/dL (ref 7–18)
CREATININE: 1.04 mg/dL (ref 0.60–1.30)
Calcium, Total: 7.2 mg/dL — ABNORMAL LOW (ref 8.5–10.1)
Chloride: 111 mmol/L — ABNORMAL HIGH (ref 98–107)
Co2: 17 mmol/L — ABNORMAL LOW (ref 21–32)
EGFR (African American): >60
GLUCOSE: 152 mg/dL — AB (ref 65–99)
Osmolality: 280 (ref 275–301)
POTASSIUM: 3.9 mmol/L (ref 3.5–5.1)
Sodium: 139 mmol/L (ref 136–145)

## 2014-01-14 LAB — CBC WITH DIFFERENTIAL/PLATELET
BASOS ABS: 0.1 10*3/uL (ref 0.0–0.1)
BASOS PCT: 0.4 %
EOS ABS: 0.1 10*3/uL (ref 0.0–0.7)
Eosinophil %: 0.3 %
HCT: 39 % (ref 35.0–47.0)
HGB: 12.9 g/dL (ref 12.0–16.0)
Lymphocyte #: 1.8 10*3/uL (ref 1.0–3.6)
Lymphocyte %: 11 %
MCH: 30.8 pg (ref 26.0–34.0)
MCHC: 33.2 g/dL (ref 32.0–36.0)
MCV: 93 fL (ref 80–100)
MONOS PCT: 6.4 %
Monocyte #: 1 x10 3/mm — ABNORMAL HIGH (ref 0.2–0.9)
Neutrophil #: 13.2 10*3/uL — ABNORMAL HIGH (ref 1.4–6.5)
Neutrophil %: 81.9 %
Platelet: 201 10*3/uL (ref 150–440)
RBC: 4.2 10*6/uL (ref 3.80–5.20)
RDW: 14.9 % — ABNORMAL HIGH (ref 11.5–14.5)
WBC: 16.1 10*3/uL — ABNORMAL HIGH (ref 3.6–11.0)

## 2014-01-15 LAB — STOOL CULTURE

## 2014-01-17 LAB — CULTURE, BLOOD (SINGLE)

## 2014-02-13 ENCOUNTER — Inpatient Hospital Stay: Payer: Self-pay | Admitting: Internal Medicine

## 2014-04-20 ENCOUNTER — Emergency Department: Payer: Self-pay | Admitting: Emergency Medicine

## 2014-04-20 LAB — COMPREHENSIVE METABOLIC PANEL
ALBUMIN: 3.9 g/dL (ref 3.4–5.0)
ALT: 24 U/L
Alkaline Phosphatase: 158 U/L — ABNORMAL HIGH
Anion Gap: 8 (ref 7–16)
BUN: 33 mg/dL — ABNORMAL HIGH (ref 7–18)
Bilirubin,Total: 0.2 mg/dL (ref 0.2–1.0)
CALCIUM: 9.7 mg/dL (ref 8.5–10.1)
Chloride: 101 mmol/L (ref 98–107)
Co2: 22 mmol/L (ref 21–32)
Creatinine: 1.67 mg/dL — ABNORMAL HIGH (ref 0.60–1.30)
EGFR (African American): 41 — ABNORMAL LOW
EGFR (Non-African Amer.): 34 — ABNORMAL LOW
Glucose: 297 mg/dL — ABNORMAL HIGH (ref 65–99)
OSMOLALITY: 281 (ref 275–301)
Potassium: 4.8 mmol/L (ref 3.5–5.1)
SGOT(AST): 30 U/L (ref 15–37)
SODIUM: 131 mmol/L — AB (ref 136–145)
Total Protein: 8.6 g/dL — ABNORMAL HIGH (ref 6.4–8.2)

## 2014-04-20 LAB — CBC
HCT: 43.2 % (ref 35.0–47.0)
HGB: 13.8 g/dL (ref 12.0–16.0)
MCH: 30.6 pg (ref 26.0–34.0)
MCHC: 32 g/dL (ref 32.0–36.0)
MCV: 96 fL (ref 80–100)
Platelet: 313 10*3/uL (ref 150–440)
RBC: 4.52 10*6/uL (ref 3.80–5.20)
RDW: 14.8 % — AB (ref 11.5–14.5)
WBC: 13.9 10*3/uL — ABNORMAL HIGH (ref 3.6–11.0)

## 2014-04-20 LAB — URINALYSIS, COMPLETE
BILIRUBIN, UR: NEGATIVE
Blood: NEGATIVE
KETONE: NEGATIVE
NITRITE: NEGATIVE
Ph: 5 (ref 4.5–8.0)
Protein: 30
RBC,UR: 10 /HPF (ref 0–5)
Specific Gravity: 1.01 (ref 1.003–1.030)
Squamous Epithelial: 6
Transitional Epi: <1
WBC UR: 212 /HPF (ref 0–5)

## 2014-04-20 LAB — DRUG SCREEN, URINE
AMPHETAMINES, UR SCREEN: NEGATIVE (ref ?–1000)
BARBITURATES, UR SCREEN: NEGATIVE (ref ?–200)
BENZODIAZEPINE, UR SCRN: POSITIVE (ref ?–200)
CANNABINOID 50 NG, UR ~~LOC~~: NEGATIVE (ref ?–50)
Cocaine Metabolite,Ur ~~LOC~~: POSITIVE (ref ?–300)
MDMA (Ecstasy)Ur Screen: NEGATIVE (ref ?–500)
METHADONE, UR SCREEN: NEGATIVE (ref ?–300)
OPIATE, UR SCREEN: NEGATIVE (ref ?–300)
PHENCYCLIDINE (PCP) UR S: NEGATIVE (ref ?–25)
Tricyclic, Ur Screen: POSITIVE (ref ?–1000)

## 2014-04-20 LAB — TROPONIN I: Troponin-I: <0.02 ng/mL

## 2014-04-20 LAB — ETHANOL: Ethanol: <3 mg/dL (ref 0–80)

## 2014-04-23 LAB — URINE CULTURE

## 2014-05-04 ENCOUNTER — Emergency Department: Payer: Self-pay | Admitting: Emergency Medicine

## 2014-05-04 LAB — CBC WITH DIFFERENTIAL/PLATELET
Basophil #: 0.1 10*3/uL (ref 0.0–0.1)
Basophil %: 0.9 %
Eosinophil #: 0.3 10*3/uL (ref 0.0–0.7)
Eosinophil %: 2.1 %
HCT: 40.5 % (ref 35.0–47.0)
HGB: 13.4 g/dL (ref 12.0–16.0)
Lymphocyte #: 3.3 10*3/uL (ref 1.0–3.6)
Lymphocyte %: 25.4 %
MCH: 31.7 pg (ref 26.0–34.0)
MCHC: 33.1 g/dL (ref 32.0–36.0)
MCV: 96 fL (ref 80–100)
Monocyte #: 0.8 x10 3/mm (ref 0.2–0.9)
Monocyte %: 6.3 %
NEUTROS ABS: 8.5 10*3/uL — AB (ref 1.4–6.5)
Neutrophil %: 65.3 %
Platelet: 356 10*3/uL (ref 150–440)
RBC: 4.24 10*6/uL (ref 3.80–5.20)
RDW: 14.5 % (ref 11.5–14.5)
WBC: 13 10*3/uL — ABNORMAL HIGH (ref 3.6–11.0)

## 2014-05-04 LAB — COMPREHENSIVE METABOLIC PANEL
ALBUMIN: 3.8 g/dL (ref 3.4–5.0)
ALT: 29 U/L
Alkaline Phosphatase: 139 U/L — ABNORMAL HIGH
Anion Gap: 9 (ref 7–16)
BUN: 20 mg/dL — ABNORMAL HIGH (ref 7–18)
Bilirubin,Total: 0.3 mg/dL (ref 0.2–1.0)
CO2: 18 mmol/L — AB (ref 21–32)
Calcium, Total: 9.1 mg/dL (ref 8.5–10.1)
Chloride: 107 mmol/L (ref 98–107)
Creatinine: 0.87 mg/dL (ref 0.60–1.30)
EGFR (Non-African Amer.): >60
Glucose: 126 mg/dL — ABNORMAL HIGH (ref 65–99)
OSMOLALITY: 272 (ref 275–301)
POTASSIUM: 4.9 mmol/L (ref 3.5–5.1)
SGOT(AST): 55 U/L — ABNORMAL HIGH (ref 15–37)
Sodium: 134 mmol/L — ABNORMAL LOW (ref 136–145)
Total Protein: 8.5 g/dL — ABNORMAL HIGH (ref 6.4–8.2)

## 2014-05-04 LAB — URINALYSIS, COMPLETE
Bacteria: NONE SEEN
Bilirubin,UR: NEGATIVE
Blood: NEGATIVE
Glucose,UR: NEGATIVE mg/dL (ref 0–75)
Hyaline Cast: 12
Leukocyte Esterase: NEGATIVE
NITRITE: NEGATIVE
PH: 5 (ref 4.5–8.0)
Protein: 100
RBC,UR: 2 /HPF (ref 0–5)
SPECIFIC GRAVITY: 1.019 (ref 1.003–1.030)
WBC UR: 7 /HPF (ref 0–5)

## 2014-05-04 LAB — TROPONIN I: Troponin-I: <0.02 ng/mL

## 2014-05-04 LAB — PREGNANCY, URINE: Pregnancy Test, Urine: NEGATIVE m[IU]/mL

## 2014-05-04 LAB — LIPASE, BLOOD: Lipase: 238 U/L (ref 73–393)

## 2014-05-09 ENCOUNTER — Emergency Department: Payer: Self-pay | Admitting: Student

## 2014-05-09 LAB — CBC
HCT: 39.4 % (ref 35.0–47.0)
HGB: 13.2 g/dL (ref 12.0–16.0)
MCH: 31.7 pg (ref 26.0–34.0)
MCHC: 33.5 g/dL (ref 32.0–36.0)
MCV: 95 fL (ref 80–100)
Platelet: 376 10*3/uL (ref 150–440)
RBC: 4.16 10*6/uL (ref 3.80–5.20)
RDW: 14.2 % (ref 11.5–14.5)
WBC: 10.9 10*3/uL (ref 3.6–11.0)

## 2014-05-09 LAB — URINALYSIS, COMPLETE
Bacteria: NONE SEEN
Bilirubin,UR: NEGATIVE
Blood: NEGATIVE
Glucose,UR: NEGATIVE mg/dL (ref 0–75)
Nitrite: NEGATIVE
PH: 5 (ref 4.5–8.0)
Protein: 30
RBC,UR: NONE SEEN /HPF (ref 0–5)
SPECIFIC GRAVITY: 1.016 (ref 1.003–1.030)
Squamous Epithelial: 2
WBC UR: 5 /HPF (ref 0–5)

## 2014-05-09 LAB — COMPREHENSIVE METABOLIC PANEL
ALBUMIN: 3.5 g/dL (ref 3.4–5.0)
ANION GAP: 9 (ref 7–16)
Alkaline Phosphatase: 125 U/L — ABNORMAL HIGH
BUN: 13 mg/dL (ref 7–18)
Bilirubin,Total: 0.2 mg/dL (ref 0.2–1.0)
CO2: 22 mmol/L (ref 21–32)
CREATININE: 0.88 mg/dL (ref 0.60–1.30)
Calcium, Total: 9 mg/dL (ref 8.5–10.1)
Chloride: 107 mmol/L (ref 98–107)
EGFR (African American): >60
EGFR (Non-African Amer.): >60
GLUCOSE: 154 mg/dL — AB (ref 65–99)
Osmolality: 279 (ref 275–301)
Potassium: 4.3 mmol/L (ref 3.5–5.1)
SGOT(AST): 40 U/L — ABNORMAL HIGH (ref 15–37)
SGPT (ALT): 28 U/L
SODIUM: 138 mmol/L (ref 136–145)
Total Protein: 8.2 g/dL (ref 6.4–8.2)

## 2014-05-09 LAB — LIPASE, BLOOD: LIPASE: 91 U/L (ref 73–393)

## 2014-05-12 ENCOUNTER — Emergency Department: Payer: Self-pay | Admitting: Emergency Medicine

## 2014-05-12 LAB — DRUG SCREEN, URINE
Amphetamines, Ur Screen: NEGATIVE (ref ?–1000)
BARBITURATES, UR SCREEN: NEGATIVE (ref ?–200)
Benzodiazepine, Ur Scrn: NEGATIVE (ref ?–200)
COCAINE METABOLITE, UR ~~LOC~~: NEGATIVE (ref ?–300)
Cannabinoid 50 Ng, Ur ~~LOC~~: NEGATIVE (ref ?–50)
MDMA (ECSTASY) UR SCREEN: NEGATIVE (ref ?–500)
METHADONE, UR SCREEN: NEGATIVE (ref ?–300)
OPIATE, UR SCREEN: POSITIVE (ref ?–300)
PHENCYCLIDINE (PCP) UR S: NEGATIVE (ref ?–25)
TRICYCLIC, UR SCREEN: NEGATIVE (ref ?–1000)

## 2014-05-12 LAB — COMPREHENSIVE METABOLIC PANEL
ANION GAP: 14 (ref 7–16)
AST: 33 U/L (ref 15–37)
Albumin: 3.7 g/dL (ref 3.4–5.0)
Alkaline Phosphatase: 117 U/L — ABNORMAL HIGH
BUN: 10 mg/dL (ref 7–18)
Bilirubin,Total: 0.2 mg/dL (ref 0.2–1.0)
CO2: 18 mmol/L — AB (ref 21–32)
Calcium, Total: 8.5 mg/dL (ref 8.5–10.1)
Chloride: 104 mmol/L (ref 98–107)
Creatinine: 0.8 mg/dL (ref 0.60–1.30)
EGFR (African American): >60
Glucose: 125 mg/dL — ABNORMAL HIGH (ref 65–99)
OSMOLALITY: 272 (ref 275–301)
Potassium: 4.1 mmol/L (ref 3.5–5.1)
SGPT (ALT): 24 U/L
Sodium: 136 mmol/L (ref 136–145)
TOTAL PROTEIN: 8.4 g/dL — AB (ref 6.4–8.2)

## 2014-05-12 LAB — URINALYSIS, COMPLETE
BACTERIA: NONE SEEN
BILIRUBIN, UR: NEGATIVE
BLOOD: NEGATIVE
Glucose,UR: NEGATIVE mg/dL (ref 0–75)
LEUKOCYTE ESTERASE: NEGATIVE
Nitrite: NEGATIVE
PH: 5 (ref 4.5–8.0)
RBC,UR: <1 /HPF (ref 0–5)
SPECIFIC GRAVITY: 1.013 (ref 1.003–1.030)
Squamous Epithelial: 5
Transitional Epi: <1
WBC UR: 1 /HPF (ref 0–5)

## 2014-05-12 LAB — CBC
HCT: 42.4 % (ref 35.0–47.0)
HGB: 14 g/dL (ref 12.0–16.0)
MCH: 30.9 pg (ref 26.0–34.0)
MCHC: 33 g/dL (ref 32.0–36.0)
MCV: 94 fL (ref 80–100)
PLATELETS: 358 10*3/uL (ref 150–440)
RBC: 4.52 10*6/uL (ref 3.80–5.20)
RDW: 13.6 % (ref 11.5–14.5)
WBC: 11.6 10*3/uL — ABNORMAL HIGH (ref 3.6–11.0)

## 2014-05-12 LAB — CK TOTAL AND CKMB (NOT AT ARMC)
CK, Total: 90 U/L
CK-MB: 1.2 ng/mL (ref 0.5–3.6)

## 2014-05-12 LAB — TROPONIN I: Troponin-I: <0.02 ng/mL

## 2014-05-14 ENCOUNTER — Emergency Department: Payer: Self-pay | Admitting: Emergency Medicine

## 2014-05-14 LAB — CBC
HCT: 37.1 % (ref 35.0–47.0)
HGB: 12.4 g/dL (ref 12.0–16.0)
MCH: 30.9 pg (ref 26.0–34.0)
MCHC: 33.5 g/dL (ref 32.0–36.0)
MCV: 92 fL (ref 80–100)
Platelet: 307 10*3/uL (ref 150–440)
RBC: 4.02 10*6/uL (ref 3.80–5.20)
RDW: 13.9 % (ref 11.5–14.5)
WBC: 9.6 10*3/uL (ref 3.6–11.0)

## 2014-05-14 LAB — TROPONIN I

## 2014-05-14 LAB — BASIC METABOLIC PANEL
ANION GAP: 11 (ref 7–16)
BUN: 11 mg/dL (ref 7–18)
Calcium, Total: 8.3 mg/dL — ABNORMAL LOW (ref 8.5–10.1)
Chloride: 107 mmol/L (ref 98–107)
Co2: 18 mmol/L — ABNORMAL LOW (ref 21–32)
Creatinine: 0.88 mg/dL (ref 0.60–1.30)
GLUCOSE: 157 mg/dL — AB (ref 65–99)
Osmolality: 275 (ref 275–301)
POTASSIUM: 3.6 mmol/L (ref 3.5–5.1)
Sodium: 136 mmol/L (ref 136–145)

## 2014-06-29 DEATH — deceased

## 2014-10-19 NOTE — H&P (Signed)
PATIENT NAME:  Debra Stone, Debra Stone#:  [id number] DATE OF BIRTH:  [birth year]  DATE OF ADMISSION:  02/07/2013  REFERRING PHYSICIAN: Emergency Room MD.   ATTENDING PHYSICIAN: Wyllow Seigler B. Jennet MaduroPucilowska, MD  IDENTIFYING DATA: Debra Stone is a 54 year old female with history of depression.   CHIEF COMPLAINT: "I overdosed."   HISTORY OF PRESENT ILLNESS: Debra Stone has been a patient of Debra Stone, who prescribes an antidepressant and anxiety medication. She saw Debra Stone on the day of admission. She complained to him that the medication prescribed has not been working. He reportedly called the pharmacy to adjust her medications. The patient reports being under considerable stress. Some of it is positive. She just got a new apartment that her friends and family helped her furnish. She is out of a bad relationship. However, her 3 daughters, ages 3630, 3828 and 5026, started spreading the news that the patient was dead. This made her very upset because she does not understand why her kids hate her so much. She has been a good mother to them, and other than mental illness, which they have, she sees no explanation. On the day of admission, she overdosed on Klonopin and other pills prescribed for someone else. She does not know the name of the other pills. She was found by a friend in the locked apartment. She intended to die, but now she is lucky to be alive. She reports poor sleep, decreased appetite, anhedonia, feeling of guilt, hopelessness, worthlessness, crying, poor energy and concentration, social isolation and heightened anxiety that culminated in a suicide attempt. She denies psychotic symptoms or symptoms suggestive of bipolar mania. She denies alcohol, prescription pill or illicit drug abuse.   PAST PSYCHIATRIC HISTORY: She has never been hospitalized and denies any prior suicide attempt. She has been a patient of Debra Stone. She also has a therapist coming to her house. I wonder if this is community support team.  The patient mentions that she, in the past, was diagnosed with PTSD and reports nightmares and flashbacks that are truly troubling. She did not share any details about her history of traumatic experiences.   FAMILY PSYCHIATRIC HISTORY: There is a history of depression and bipolar in the family. Her girls reportedly have PTSD and schizophrenia.   PAST MEDICAL HISTORY: Hypertension, dyslipidemia, arthritis, chronic pain, history of seizures. She had seizures between March and June. of this year. She is in the care of Dr. Sherryll BurgerShah, who prescribed Vimpat.    MEDICATIONS ON ADMISSION:  1. Norvasc 10 mg daily. 2. Fioricet 1 tablet every 4 hours as needed for headache.  3. Atenolol 100 mg daily. 4. Klonopin 1 mg every 8 hours. 5. TriCor 145 mg daily. 6. Vimpat 50 mg twice daily.  7. Mobic 7.5 mg twice daily. 8. Remeron 15 mg at bedtime. 9. Lyrica 100 mg 3 times daily.  10. Sleeping pill unknown.   SOCIAL HISTORY: She is disabled. She lives by herself in a new apartment. There is a conflict with her girls. She talks about her 53 year old grandson, who had been in her care for many years in and out, and now is with his mother, who reportedly is not a good mother, and environment he is in is not safe. The patient cries about losing him, but she does not have the custody. She consulted lawyers, but does not have the money to win a legal battle. She denies substance abuse.   REVIEW OF SYSTEMS:  CONSTITUTIONAL: No fevers or chills. No weight changes.  EYES:  No double or blurred vision.  ENT: No hearing loss.  RESPIRATORY: No shortness of breath or cough.  CARDIOVASCULAR: No chest pain or orthopnea.  GASTROINTESTINAL: No abdominal pain, nausea, vomiting or diarrhea. Normal bowel movements.  GENITOURINARY: No incontinence or frequency.  ENDOCRINE: No heat or cold intolerance.  LYMPHATIC: No anemia or easy bruising.  INTEGUMENTARY: No acne or rash.  MUSCULOSKELETAL: Positive for pain.  NEUROLOGIC: No  tingling or weakness.  PSYCHIATRIC: See history of present illness for details.   PHYSICAL EXAMINATION:  VITAL SIGNS: Blood pressure 126/78, pulse 67, respirations 18, temperature 97.9.  GENERAL: This is a well-developed female in no acute distress.  HEENT: The pupils are equal, round and reactive to light. Sclerae are anicteric.  NECK: Supple. No thyromegaly.  LUNGS: Clear to auscultation. No dullness to percussion.  HEART: Regular rhythm and rate. No murmurs, rubs or gallops.  ABDOMEN: Soft, nontender, nondistended. Positive bowel sounds.  MUSCULOSKELETAL: Normal muscle strength in all extremities.  SKIN: No rashes or bruises.  LYMPHATIC: No cervical adenopathy.  NEUROLOGIC: Cranial nerves II through XII are intact.   LABORATORY DATA: Chemistries are within normal limits except for blood glucose of 180 and BUN of 20. Blood alcohol level is 0. LFTs within normal limits. Serum Dilantin 1.9. Urine tox screen positive for barbiturates from Fioricet. CBC within normal limits except for white blood count of 11.2. Urinalysis is not suggestive of urinary tract infection. Serum acetaminophen less than 2, serum salicylates 3.3. EKG: Normal sinus rhythm, possible left atrial enlargement, borderline EKG.   MENTAL STATUS EXAMINATION ON ADMISSION: The patient is alert and oriented to person, place, time and situation. She is pleasant, polite and cooperative. She is very tearful talking about her troubles. She is well groomed. She is wearing hospital scrubs and looks tired. She maintains good eye contact. Her speech is of normal rhythm, rate and volume. Mood is depressed with tearful affect. Thought processing is logical and goal oriented. Thought content: She denies suicidal or homicidal ideation, but was admitted after a suicide attempt by medication overdose. There are no delusions or paranoia. There are no auditory or visual hallucinations. Her cognition is grossly intact. She registers 3 out of 3 and  recalls 3 out of 3 objects after 5 minutes. She can spell world forward and backward. She knows the current president. Her insight and judgment are questionable.   SUICIDE RISK ASSESSMENT ON ADMISSION: This is a patient with a history of depression who attempted suicide, under considerable stress from family conflict. She is at increased risk for suicide.   INITIAL DIAGNOSES:  AXIS I: Major depressive disorder, recurrent, severe, without psychotic features.  AXIS II: Deferred.  AXIS III: Hypertension, dyslipidemia, seizure disorder.  AXIS IV: Mental illness, family conflict, primary support.  AXIS V: Global assessment of functioning on admission 25.   PLAN: The patient was admitted to [facility] Medicine Unit for safety, stabilization and medication management. She was initially placed on suicide precautions and was closely monitored for any unsafe behaviors. She underwent full psychiatric and risk assessment. She received pharmacotherapy, individual and group psychotherapy, substance abuse counseling and support from therapeutic milieu.   1. Suicidal ideation. This has resolved. The patient is able to contract for safety. She is glad to be alive.  2. Mood. We will continue Remeron as prescribed in the community and trazodone for sleep.  3. Medical. We will continue all medications as prescribed by her primary provider.  4. Chronic pain. We will continue all  medications for neuropathy and arthritis.  5. Anxiety. She endorses severe symptoms of PTSD. Will start Minipress twice daily for nightmares and flashbacks.  6. Disposition. She will be discharged to home. She will follow up with Dr. Janeece Riggers.   ____________________________ Ellin Goodie. Jennet Maduro, MD jbp:OSi D: 02/08/2013 11:34:22 ET T: 02/08/2013 12:18:23 ET JOB#: [id number]  cc: Demetrius Barrell B. Jennet Maduro, MD, <Dictator> Shari Prows MD ELECTRONICALLY SIGNED 02/11/2013 15:09

## 2014-10-19 NOTE — Consult Note (Signed)
PATIENT NAME:  Debra Stone, [name] MR#:  [id number] DATE OF BIRTH:  24-Nov-1960  DATE OF CONSULTATION:  02/07/2013  REFERRING PHYSICIAN:  Darien Ramusavid W. Kaminski, MD CONSULTING PHYSICIAN:  Ardeen FillersUzma S. Garnetta BuddyFaheem, MD  REASON FOR CONSULTATION: "I took extra pills to calm me down."   HISTORY OF PRESENT ILLNESS: The patient is a 54 year old Caucasian female who presented to the ED accompanied by the EMS for possible overdose of clonazepam while intoxicated. According to the initial assessment, the patient reported having a lot of anger towards herself. She reported that she has bad anxiety and panic attacks. She reported that her boyfriend was arguing with her and they were talking mean to her. She stated that they put her down, and after she ate supper, she started drinking and consumed approximately 40 ounces of beer and then took her medications. She took a handful of clonazepam and mirtazapine. She reported that she was not trying to overdose and was trying to sleep.   During my interview, the patient reported that she saw Dr. [name] yesterday, and gave her the prescriptions of clonazepam and mirtazapine. Reported that Dr. [name] was going to call [facility] to get the right medications for her. The patient stated that she was asleep, and then a bunch of people were waking her up, and they brought her to the hospital. They thought that she was trying to overdose. The patient stated that she has bad anxiety attacks, and she takes clonazepam 3 times per day. She has already stopped the Viibryd and the Cymbalta as they do not help her. The patient mentioned during may interview that there are "just things" going on in the family, and she does not want to discuss them any further. She reported that Dr. [name] knows, and she does not want to tell them as it is very painful. She also mentioned she takes mirtazapine 2 pills at bedtime. Reported that she has some conflict with her daughters. She stated that the daughters are really mean, and  she has a lot of pain, and she does not want to disclose that to anybody. She remained sad and depressed during the interview and was unable to contract for safety.   PAST PSYCHIATRIC HISTORY: The patient reported that she was taken to [facility] after her therapist complained about her, and she was concerned about the patient overdosing on her medications. She reported that she stayed there for only a couple of hours. The patient reported that she has never tried to hurt herself in the past.   SUBSTANCE ABUSE HISTORY: The patient reported that she drinks alcohol and has been consuming approximately one to two 40-ounce beers. She reported that she has history of seizures as well as blackouts. She smokes 1 pack of cigarettes per day.   PAST MEDICAL HISTORY: The patient reported history of seizure disorder, chronic pain, GERD, hypercholesterolemia.   CURRENT MEDICATIONS:  1. Amlodipine 10 mg p.o. daily. 2. Atenolol 100 mg p.o. daily.  3. Clonazepam 1 mg p.o. t.i.d.  4. TriCor 145 mg p.o. daily. 5. Vimpat 50 mg p.o. b.i.d.  6. Meloxicam 7.5 mg p.o. b.i.d. 7. Remeron 15 mg p.o. at bedtime. 8. Lyrica 100 mg p.o. b.i.d.   ALLERGIES: VICODIN.   SOCIAL HISTORY: The patient reported that she currently lives with her boyfriend as well as her daughters. She has a poor relationship with them. She remains evasive and was not trying to tell in detail about the relationship issues at this time.   VITAL SIGNS: Temperature  98.3, pulse 76, respirations 18, blood pressure 139/86.   LABORATORY DATA: Glucose 180, BUN 20, creatinine 1.18, sodium 140, potassium 4.5, chloride 107, bicarbonate 23, anion gap 10, osmolality 287. Blood alcohol level was less than 3. Protein 7.9, albumin 3.8, bilirubin 0.2, alkaline phosphatase 100, AST 29, ALT 23. Dilantin level 1.9. Urine drug screen positive for barbiturates. WBC 11.2, hemoglobin 14.0, hematocrit 40.6, MCV 93.  REVIEW OF SYSTEMS:  CONSTITUTIONAL: No fever or  chills. No weight changes.  EYES: No double or blurred vision.  RESPIRATORY: No shortness of breath or cough.  CARDIOVASCULAR: No chest pain or orthopnea.  GASTROINTESTINAL: No abdominal pain, nausea, vomiting or diarrhea.  GENITOURINARY: No incontinence or frequency.  ENDOCRINE: No heat or cold intolerance.  LYMPHATIC: No anemia or easy bruising.  INTEGUMENTARY: No acne or rash.  MUSCULOSKELETAL: Denies any muscle or joint pain.  NEUROLOGIC: Has history of seizures.   MENTAL STATUS EXAMINATION: The patient is a moderately built female who appeared somewhat disheveled. She maintained fair eye contact. Her speech was normal in tone and volume. Mood was depressed and anxious, and she was tearful during the later part of the interview. Affect was congruent. Thought process was circumstantial, and she remains evasive. Thought content was non-delusional. She was unable to contract for safety at this time. She does not have any perceptual disturbances. She attempted suicide by overdosing on her pills.   DIAGNOSTIC IMPRESSION:  AXIS I:  1. Bipolar disorder, not otherwise specified.  2. Anxiety disorder.  3. Alcohol abuse.  AXIS II: None.  AXIS III: Please review the medical history.   TREATMENT PLAN:  1. The patient will be admitted to the Inpatient Behavioral [facility] Unit for stabilization and safety.  2. I will restart her back on her current medications as prescribed.  3. The patient will be monitored by the treatment team, and her medications will be adjusted.  4. Will obtain collateral information from her outpatient psychiatrist, Dr. Janeece Riggers.   Thank you for allowing me to participate in the care of this patient.   ____________________________ Ardeen Fillers. Garnetta Buddy, MD usf:OSi D: 02/07/2013 12:34:57 ET T: 02/07/2013 13:04:35 ET JOB#: [id number]  cc: Ardeen Fillers. Garnetta Buddy, MD, <Dictator> Rhunette Croft MD ELECTRONICALLY SIGNED 02/09/2013 13:10

## 2014-10-20 NOTE — Consult Note (Signed)
Brief Consult Note: Diagnosis: rectal bleeding abdominal pain.   Patient was seen by consultant.   Recommend further assessment or treatment.   Discussed with Attending MD.   Comments: Patient seen and examined, full consult to follow.  Patient with initial onset of abdominal pain late yesterday pm, then with acute rectal bleeding occurring after coming to the ER last night.   Currently continuing wiht currently with feculant appearing red tinged watery materiual in a rectal tube.  Continues with abdominal pain, no rebound, pain present in excess of examination.  Will get stat met b and then ct abd and pelcis W/wo contrast as clinically feasible.  Bleeding scan negative.   of note, patietn threatening to go home when family gets here, I have asked patietn to stay untill at least further testing is done.  My concern is for colonic/mesenteric ischemia, most likely the former per presentation.  Discussed with Dr Posey Pronto.  Full consult to follow.  Electronic Signatures for Addendum Section:  Loistine Simas (MD) (Signed Addendum 18-Jul-15 18:38)  please see full consult [phone].   Electronic Signatures: Loistine Simas (MD)  (Signed 18-Jul-15 11:22)  Authored: Brief Consult Note   Last Updated: 18-Jul-15 18:38 by Loistine Simas (MD)

## 2014-10-20 NOTE — H&P (Signed)
PATIENT NAME:  Debra Stone, Debra Stone MR#:  [id number] DATE OF BIRTH:  1960/09/06  DATE OF ADMISSION:  12/31/2013  PRIMARY CARE PHYSICIAN:  Meindert A. Lacie ScottsNiemeyer, MD  CHIEF COMPLAINT:  Not feeling well and I passed out at home.   HISTORY OF PRESENT ILLNESS:  [name] is a  54 year old Caucasian female with past medical history of PTSD, hypertension, dyslipidemia, arthritis, chronic pain, history of seizure disorder who follows with Dr. Sherryll BurgerShah, who comes to the Emergency Room after she complained of having passing out spell at home. She was also noted to have sugar of 400 in the Emergency Room. The patient does not check sugars at home. She felt yesterday hot although her room temp per pt was okShe reports her air conditioning is working good, however, she does not turn it on all of the time because it gets "very cold." In the Emergency Room, patient was found to be very hypotensive with orthostatic hypotension and dizziness while patient was trying to ambulate. Her blood pressure went down in the 70s. She received a liter of IV fluid. She wanted to go home to take care of a grandson, however, I advised her to get admitted for syncope secondary to orthostatic hypotension and possibly dehydration and poor p.o. intake. Her sodium was 128. She did have some chest pain earlier, however, at present is chest pain-free.   PAST MEDICAL HISTORY:  1.  Hypertension.   2.  Type 2 diabetes.  3.  Dyslipidemia.  4. Arthritis and chronic pain.  5.  History of seizure.  6.  History of coronary artery disease. Patient says she has [facility]. She follows up with a cardiologist named Dr. Lovell SheehanJenkins FAMILY HISTORY: Positive for depression and bipolar disorder. There is history of PTSD and schizophrenia too in the family.   SOCIAL HISTORY: She is disabled, lives by herself in an apartment. She denies any substance abuse. Continues to smoke about a pack a day. Smoking cessation was advised, about  4 minutes were spent. The patient does not seem to be motivated.   REVIEW OF SYSTEMS:  CONSTITUTIONAL: Positive for fatigue and weakness.  EYES: No blurred or double vision, glaucoma or cataract.  ENT: No tinnitus, ear pain hearing loss or postnasal drip.  RESPIRATORY: Positive for cough, no shortness of breath or dyspnea. Positive for chronic obstructive pulmonary disease.  CARDIOVASCULAR: No chest pain, orthopnea or edema. Positive for hypotension.  GASTROINTESTINAL:  Positive for some nausea. No diarrhea, abdominal pain. No hematemesis.  GENITOURINARY:  No dysuria, hematuria, frequency.  ENDOCRINE: No polyuria, nocturia or thyroid problems.  HEMATOLOGY: No anemia, easy bruising or bleeding.  SKIN: No acne, rash or lesion.  MUSCULOSKELETAL: Positive for arthritis and back pain.  NEUROLOGICAL:  No cerebrovascular accident, transient ischemic attack. Positive for weakness and syncope.  PSYCHIATRIC:  Positive for anxiety and depression.  All other systems reviewed and negative.   MEDICATIONS AT HOME:  1.  Vimpat 50 mg b.i.d.  2.  TriCor 145 mg daily.  3.  Trazodone 100 mg at bedtime.  4.  Tramadol 300 mg daily.  5.  Symbicort 160/4.5 two puffs b.i.d.  6.  Remeron 15 mg at bedtime.  7.  Metformin 500 mg 2 tablets b.i.d.  8.  Meloxicam 15 mg p.o. daily.  9.  Lyrica 100 mg 1 capsule b.i.d.  10.  Lisinopril 40 mg daily.  11.  Clonazepam 0.5 mg b.i.d.  12.  Butalbital compound 0325/50/ 40 one tablet every 6  hours as needed.  13.  Brintellix 10 mg 1 p.o. daily.  14.  Amlodipine 10 mg p.o.  15.  Alprazolam 1 mg b.i.d.  16.  Alendronate 70 mg p.o. once a week.   ALLERGIES: TO VICODIN.   PHYSICAL EXAM:  GENERAL:  The patient is awake, alert and oriented x ,3 not in acute distress.  VITAL SIGNS:  Afebrile. Pulse is 78, blood pressure is 86/55. On walking it is 76/55. Saturation 90% on 2 liters.  HEENT: Overall, appears disheveled.  HEENT atraumatic, normocephalic. Pupils are equal,  round and reactive to light and accommodation. EOMI intact. Oral mucosa is dry.  NECK: Supple. No JVD, no carotid bruit.  RESPIRATORY: Clear to auscultation bilaterally. No rales, rhonchi, respiratory distress or labored breathing.  CARDIOVASCULAR: Both heart sounds are normal. Rate rhythm regular. PMI not lateralized. Chest nontender. Good femoral pulses. Feeble pedal pulses. No lower extremity edema. ABDOMEN:  Soft, benign, nontender. No organomegaly. Positive bowel sounds.  NEURO EXAM: Grossly intact cranial nerves II through XII.  No motor or sensory deficit. No focal neuro deficits.  SKIN: Warm and dry.   LABORATORY, DIAGNOSTIC AND RADIOLOGICAL DATA:   1.  EKG shows normal sinus rhythm.  2.  Sodium is 128,  creatinine 1.34, BUN is 28, alkaline phosphatase 131, SGOT is 41. White count is 17.3, hemoglobin and hematocrit is 12.1 and 26.6.  3.  CT of the head no acute abnormality.  4.  CT cervical spine shows no osseous injury of the cervical spine.  5.  Urinalysis negative for urinary tract infection.  6.  Urine drug screen positive for benzodiazepines and barbiturates.  7.  Troponin is 0.02.   ASSESSMENT: A 54 year old, Debra Stone, with history of depression, anxiety, chronic back pain, seizures, coronary artery disease, status post cardiac stents, comes in with:  1.  Syncopal episode suspected due to orthostatic hypotension in the setting of dehydration and  acute renal failure. The patient's sodium is down to 128. Her creatinine is elevated at 1.34. She looks clinically dry.  Will admit her to medical floor, off-unit telemetry and continue IV fluids for hydration. The patient is also encouraged to take oral fluids. Will hold off on her blood pressure meds and any other benzodiazepines and avoid nephrotoxins. Will monitor blood pressure and resume blood pressure meds once blood pressure is stable.  2,  Hyponatremia secondary to dehydration. Continue IV hydration. Check I's and O's. avoid  nephrotoxins.  3.  Acute renal failure. Again, continue aggressive IV fluids and avoid nephrotoxins. Monitor I's and O's.  4.  Tobacco abuse. The patient was advised on smoking cessation, about 4 minutes spent. She does not seen to be motivated.  5.  Seizure disorder. No seizures witnessed.  The patient will be continued on Vimpat.  6.  Chronic back pain. The patient is taking not a pain medication given her low blood pressure. I have told her I am going to hold off on her pain medications for now.  7.  Elevated white count of 17,000, appears reactive. No source of infection identified so far.  Will continue to monitor white count. I will hold off on any antibiotics at this time.  8.  Deep vein thrombosis prophylaxis, subcutaneous heparin.  9.  Physical therapy and care management for discharge planning.   TIME SPENT: 50 minutes.    ____________________________ Wylie Hail Allena Katz, MD sap:cs D: 12/31/2013 15:58:00 ET T: 12/31/2013 16:31:19 ET JOB#: [id number]  cc: Meindert A. Lacie Scotts, MD Ellon Marasco A. Allena Katz, MD, <  Dictator>  Willow Ora MD ELECTRONICALLY SIGNED 01/01/2014 10:42

## 2014-10-20 NOTE — H&P (Signed)
PATIENT NAME:  Debra Stone, Debra Stone MR#:  [id number] DATE OF BIRTH:  1960-08-20  DATE OF ADMISSION:  01/12/2014  REFERRING PHYSICIAN:  Dr. Reita Cliche.   PRIMARY CARE PHYSICIAN:  Dr. Brunetta Genera.   CHIEF COMPLAINT:  Back pain.   HISTORY OF PRESENT ILLNESS:  A 54 year old Caucasian female with history of hypertension, hyperlipidemia, type 2 diabetes, seizure disorder, coronary artery disease as well as chronic low back pain is presenting with worsening low back pain.  She describes chronic back pain that was recently worsened over the past few days, located in the lower back, described only as pain, 10 out of 10 in intensity.  No worsening or relieving factors.  No radiation.  During evaluation in the Emergency Department, she had a large GI bleed, bright red blood per rectum with systolic blood pressures dropping into the 50s.  She is receiving 2 units packed red blood cells.  Per the Emergency Department staff, blood pressure has improved to 09X systolic.  She describes having small flecks of red blood in her stool for the last few days; however, she thought nothing of this.  She is somewhat lethargic at this point, but denies any further complaints.   REVIEW OF SYSTEMS:  CONSTITUTIONAL:  Positive for fatigue, weakness.  Denies fevers, chills.  EYES:  Denies blurred vision, double vision, eye pain.  EARS, NOSE, THROAT:  Denies tinnitus, ear pain, hearing loss.  RESPIRATORY:  Denies cough, wheeze, shortness of breath.  CARDIOVASCULAR:  Denies chest pain, palpitations, edema.  GASTROINTESTINAL:  Denies nausea, vomiting, diarrhea.  Denies any abdominal pain.  Positive for bright red blood per rectum as described above.  GENITOURINARY:  Denies dysuria, hematuria.  ENDOCRINE:  Denies nocturia or thyroid problems.  HEMATOLOGIC AND LYMPHATIC:  Denies easy bruising.  Positive for bleeding as described above.  SKIN:  Denies rashes or lesions.  MUSCULOSKELETAL:  Positive for back pain as described above.  Denies  any neck, shoulder, knee, pain or arthritic symptoms.  NEUROLOGIC:  Denies paralysis, paresthesias.  PSYCHIATRIC:  Denies anxiety or depressive symptoms.  Otherwise, full review of systems performed by me is negative.   PAST MEDICAL HISTORY:  Hypertension, hyperlipidemia, type 2 diabetes, seizure disorder, coronary artery disease, chronic back pain.   SOCIAL HISTORY:  Positive for tobacco usage.  No drug or alcohol usage.   FAMILY HISTORY:  No documented cardiovascular or pulmonary disorders.   ALLERGIES:  VICODIN.   MEDICATIONS:   meloxicam 15 mg daily, tramadol 300 mg by mouth daily, lisinopril 40 mg by mouth daily, clonazepam 0.5 mg by mouth twice daily, Lyrica 100 mg by mouth twice daily, Vimpat 50 mg by mouth twice daily, Brintellix 10 mg by mouth daily, mirtazapine 50 mg by mouth daily, trazodone 100 mg 2 tabs at nighttime as needed for sleep, metformin 500 mg 2 tablets by mouth twice daily, TriCor 145 mg by mouth daily, alprazolam 1 tablet twice daily as needed for anxiety, alendronate 70 mg by mouth q. weekly, Symbicort 160/4.5 mcg inhalation 2 puffs twice daily, Norvasc 10 mg by mouth daily.   PHYSICAL EXAMINATION: VITAL SIGNS:  Temperature 97.9, heart rate 120, respirations 32, blood pressure at lowest 59/39, currently 100/76, saturating 84% on room air, 96% on supplemental O2.  Weight 76.2 kg, BMI of 30.8.  GENERAL:  Ill-appearing Caucasian female given blood pressure.  HEAD:  Normocephalic, atraumatic.  EYES:  Pupils equal, round, reactive to light.  Extraocular muscles intact.  No scleral icterus.  MOUTH:  Moist mucosal membrane.  Dentition intact.  No abscess noted.  EAR, NOSE, THROAT:  Clear without exudates.  No external lesions.  NECK:  Supple.  No thyromegaly.  No nodules.  No JVD.  PULMONARY:  Decreased breath sounds throughout all lung fields secondary to effort; however, no frank wheezes, rales, rhonchi.  Poor respiratory effort.  CHEST:  Nontender to palpation.   CARDIOVASCULAR:  S1, S2, tachycardic.  No murmurs, rubs, or gallops.  No edema.  Pedal pulses 2+ bilaterally.  GASTROINTESTINAL:  Soft, nontender, nondistended.  No masses.  Positive bowel sounds.  No hepatosplenomegaly.  MUSCULOSKELETAL:  No swelling, clubbing, or edema.  Range of motion full in all extremities.  NEUROLOGIC:  Cranial nerves II through XII intact.  No gross focal neurological deficits.  Sensation intact.  Reflexes intact.  SKIN:  No ulceration, lesions, rashes, or cyanosis.  Skin warm, dry.  Turgor intact.  PSYCHIATRIC:  Mood and affect blunted.  She is somewhat somnolent, however arouses easily to verbal stimuli when she is awake, oriented x 3.  Insight and judgment appear to be intact.   LABORATORY DATA:  Chest x-ray performed reveals a central left IJ in the distal SVC, bilateral atelectasis.  Remainder of laboratory data:  Sodium 133, potassium 3.2, chloride 102, bicarb 17, anion gap of 14, BUN 19, creatinine 1.69, glucose 229, lipase 415.  LFTs:  Total protein 8.3, alk phos 133, AST 50, cardiac enzymes within normal limits.  Troponin less than 0.02.  WBC 25.5, hemoglobin 16.3, platelets of 389.  INR 1.5.  Lactic acid of 6.1.   ASSESSMENT AND PLAN:  A 54 year old Caucasian female presenting with low back pain, in the Emergency Department had massive gastrointestinal bleed with blood pressure dropping into the 50s.  1.  Gastrointestinal bleed.  Unclear if this is upper versus lower, however given history it is likely lower, regardless we will initiate Protonix drip.  She has already received a Protonix intravenous push.  Trend complete blood count q. 6 hours.  She is receiving 2 units packed red blood cells currently.  Transfusion threshold hemoglobin less than 7 or hypotension.  Intravenous fluid hydration, mean arterial pressure greater than 65.  The case has already been discussed with gastroenterology in the Emergency Department.  They recommended bleeding scan which will order  now.  2.  Sepsis.  Meeting septic criteria by heart rate, respiratory rate, leukocytosis, unclear etiology.  Panculture including blood and urine.  Antibiotic coverage with vancomycin and Zosyn.  Once again intravenous fluid hydration to keep mean arterial pressure greater than 65.  3.  Acute kidney injury.  Intravenous fluid hydration.  Follow urine output, renal function.  4.  Lactic acidosis.  Intravenous fluid hydration as well as keep hemoglobin above 7.  5.  Hypertension.  Hold all medications given hypotension.  6.  Venous thromboembolism prophylaxis with sequential compression devices.  7.  CODE STATUS:  THE PATIENT IS A FULL CODE.  Critical care time spent 55 minutes.    ____________________________ Aaron Mose. Jerah Esty, MD dkh:ea D: 01/13/2014 00:13:43 ET T: 01/13/2014 00:38:14 ET JOB#: [id number]  cc: Aaron Mose. Lloyde Ludlam, MD, <Dictator> Verdell Dykman Woodfin Ganja MD ELECTRONICALLY SIGNED 01/13/2014 20:30

## 2014-10-20 NOTE — Discharge Summary (Signed)
PATIENT NAME:  Debra Stone, [name] MR#:  [id number] DATE OF BIRTH:  25-Aug-1960  DATE OF ADMISSION:  01/12/2014 DATE OF DISCHARGE:  01/15/2014  Patient left against medical advice on 01/15/2014.   For a detailed note, please see the history and physical done on admission by Dr. Angelica Ranavid Hower.  BRIEF HOSPITAL COURSE: This is a 54 year old female with a past medical history of chronic pain, hypertension, hyperlipidemia, type 2 diabetes, seizure disorder, coronary disease, who presented to the hospital with abdominal pain and lower back pain. The patient also noticed that she has been having some bloody stools. She was diagnosed with a possible GI bleed and underwent a nuclear medicine bleeding scan which was essentially negative. She also had a CT scan of the abdomen and pelvis which showed acute colitis. It was unclear whether this was ischemic colitis or infectious colitis. The patient did have stool for C. difficile and comprehensive culture which were essentially negative; therefore, this was thought to be ischemic colitis. The patient, although, was being empirically treated with IV Zosyn and being maintained on some fluids and pain control. The patient was not happy with the amount of pain medicine she was receiving, and since she has a history of chronic pain syndrome her pain medications were adjusted. The patient therefore left against medical advice on the morning of 01/15/2014. The patient was made aware of the risks of leaving against medical advice, that she would not be getting antibiotics or any further medications upon discharge. She was fully aware of this and therefore left against medical advice on 07/20. The patient did sign the against medical advice form.   TIME SPENT: 40 minutes    ____________________________ Rolly PancakeVivek J. Cherlynn KaiserSainani, MD vjs:sk D: 01/15/2014 14:24:31 ET T: 01/15/2014 23:29:21 ET JOB#: [id number]  cc: Rolly PancakeVivek J. Cherlynn KaiserSainani, MD, <Dictator> Houston SirenVIVEK J Dontez Hauss MD ELECTRONICALLY  SIGNED 01/25/2014 14:03

## 2014-10-20 NOTE — Consult Note (Signed)
Chief Complaint:  Subjective/Chief Complaint feeling some better today, no n/v, some continued abdominal pain   VITAL SIGNS/ANCILLARY NOTES: **Vital Signs.:   19-Jul-15 07:48  Vital Signs Type Q 8hr  Temperature Temperature (F) 101.4  Celsius 38.5  Pulse Pulse 107  Respirations Respirations 18  Systolic BP Systolic BP 417  Diastolic BP (mmHg) Diastolic BP (mmHg) 77  Mean BP 90  Pulse Ox % Pulse Ox % 91  Pulse Ox Activity Level  At rest  Oxygen Delivery Room Air/ 21 %  *Intake and Output.:   19-Jul-15 02:49  Stool  Pt. had loose stool with red colored sediment.    05:58  Stool  Pt.had loose watery stool with red sediment.    12:18  Stool  pt. had a medium stool   Brief Assessment:  Cardiac Regular   Respiratory clear BS   Gastrointestinal details normal Soft  Nondistended  No rebound tenderness  pain to palpation left abdomen and lower abdomen   Lab Results: Routine Micro:  17-Jul-15 23:49   Micro Text Report BLOOD CULTURE   COMMENT                   NO GROWTH IN 18-24 HOURS   ANTIBIOTIC                       Micro Text Report BLOOD CULTURE   COMMENT                   NO GROWTH IN 18-24 HOURS   ANTIBIOTIC                       Culture Comment NO GROWTH IN 18-24 HOURS  Result(s) reported on 13 Jan 2014 at 11:00PM.  Culture Comment NO GROWTH IN 18-24 HOURS  Result(s) reported on 13 Jan 2014 at 11:00PM.  18-Jul-15 17:05   Micro Text Report STOOL COMPREHENSIVE   COMMENT                   NO SALMONELLA OR SHIGELLA ISOLATED   COMMENT                   NO PATHOGENIC E.COLI DETECTED   COMMENT                   NO CAMPYLOBACTER ANTIGEN DETECTED   ANTIBIOTIC                        Micro Text Report CLOSTRIDIUM DIFFICILE   C.DIFFICILE ANTIGEN       C.DIFFICILE GDH ANTIGEN : NEGATIVE   C.DIFFICILE TOXIN A/B     C.DIFFICILE TOXINS A AND B : NEGATIVE   INTERPRETATION            Negative for C. difficile.    ANTIBIOTIC                        Culture Comment NO  SALMONELLA OR SHIGELLA ISOLATED  Culture Comment . NO PATHOGENIC E.COLI DETECTED  Culture Comment    . NO CAMPYLOBACTER ANTIGEN DETECTED  Result(s) reported on 14 Jan 2014 at 12:33PM.  Routine Chem:  19-Jul-15 04:24   Glucose, Serum  152  BUN 12  Creatinine (comp) 1.04  Sodium, Serum 139  Potassium, Serum 3.9  Chloride, Serum  111  CO2, Serum  17  Calcium (Total), Serum  7.2  Anion Gap 11  Osmolality (calc) 280  eGFR (African American) >60  eGFR (Non-African American) >60 (eGFR values <84m/min/1.73 m2 may be an indication of chronic kidney disease (CKD). Calculated eGFR is useful in patients with stable renal function. The eGFR calculation will not be reliable in acutely ill patients when serum creatinine is changing rapidly. It is not useful in  patients on dialysis. The eGFR calculation may not be applicable to patients at the low and high extremes of body sizes, pregnant women, and vegetarians.)  Result Comment LABS - This specimen was collected through an   - indwelling catheter or arterial line.  - A minimum of 57m of blood was wasted prior    - to collecting the sample.  Interpret  - results with caution.  Result(s) reported on 14 Jan 2014 at 04:43AM.  Routine Hem:  17-Jul-15 21:23   Hemoglobin (CBC)  16.3  18-Jul-15 04:29   Hemoglobin (CBC) 14.0    11:07   Hemoglobin (CBC) 14.2    17:06   Hemoglobin (CBC) 12.5 (Result(s) reported on 13 Jan 2014 at 05:29PM.)  19-Jul-15 04:24   WBC (CBC)  16.1  RBC (CBC) 4.20  Hemoglobin (CBC) 12.9  Hematocrit (CBC) 39.0  Platelet Count (CBC) 201  MCV 93  MCH 30.8  MCHC 33.2  RDW  14.9  Neutrophil % 81.9  Lymphocyte % 11.0  Monocyte % 6.4  Eosinophil % 0.3  Basophil % 0.4  Neutrophil #  13.2  Lymphocyte # 1.8  Monocyte #  1.0  Eosinophil # 0.1  Basophil # 0.1 (Result(s) reported on 14 Jan 2014 at 04:40AM.)   Radiology Results: CT:    18-Jul-15 15:52, CT Abdomen and Pelvis With Contrast  CT Abdomen and Pelvis With  Contrast   REASON FOR EXAM:    (1) lower abdominal and rectal pain; (2) rule out   ischemic colitis  COMMENTS:       PROCEDURE: CT  - CT ABDOMEN / PELVIS  W  - Jan 13 2014  3:52PM     CLINICAL DATA:  Abdominal pain late yesterday fall by acute rectal  bleeding.    EXAM:  CT ABDOMEN AND PELVIS WITH CONTRAST    TECHNIQUE:  Multidetector CT imaging of the abdomen and pelvis was performed  using the standard protocol following bolus administration of  intravenous contrast.    CONTRAST:  80 mL of Isovue-300 intravenous contrast    COMPARISON:  CT, 12/19/2010    FINDINGS:  There is wall thickening of the colon extending from the left  transverse colon throughout the descending colon into the sigmoid  colon. There is adjacent inflammatory stranding in the fat and a  small amount of fluid tracking along the left pericolic gutter.  Right colon shows no wall thickening. It is mildly distended with  air-fluid levels.    Proximal small bowel is mildly distended with air-fluid levels.  There is decompressed central to more distal small bowel. The more  distal ileum is normal in caliber. Partial small bowel obstruction  is possible. This is more likely due to an adynamic ileus.    Dependent subsegmental atelectasis at the lung bases. Heart is  normal in size.    Fatty infiltration of the liver. No liver mass or focal lesion.  Gallbladder surgically absent.    Spleen, pancreas, adrenal glands, kidneys, ureters:  Unremarkable.    There is nondependent air in the bladder from instrumentation.  Bladder is otherwise unremarkable.  Uterus is surgically absent.  No pelvic masses.  No pathologically enlarged lymph nodes. There is no free air. No  focal fluid collection is seen to suggest an abscess.    Bone window evaluation shows minor degenerative changes. No  osteoblastic or osteolytic lesions.     IMPRESSION:  1. Diffuse colitis involving the left colon, which may  be  inflammatory or infectious in etiology. There is a trace amount of  associated ascites as well as inflammatory stranding in the adjacent  fat. No abscess or extraluminal air.  2. Proximal small bowel dilation, mild, with air-fluid levels, with  the central small bowel being decompressed an the more distal ileum  being normal in caliber. Findings may reflect a partial small bowel  obstruction. The findings may alternatively reflect an adynamic  ileus.  3. No other acute findings.      Electronically Signed    By: Lajean Manes M.D.    On: 01/13/2014 16:00         Verified By: Lasandra Beech, M.D.,   Assessment/Plan:  Assessment/Plan:  Assessment 1) abdominal pain, colitis on CT.  Likely ischemic colitis, unknown etiology by presentation.  Multiple medical issues with CAD/stents, htn, DM2, increased lipids, sz d/o, chronic back pain.   Plan 1) continue current, will do flexible sigmoidoscopy tomorrow pm for luminal evaluation/biopsies.  I have sdiscussed the risks benefits and complications of proceedure to include not limited to bleeding infection perforation and sedation and she wishes to proceed.   Electronic Signatures: Loistine Simas (MD)  (Signed 19-Jul-15 15:29)  Authored: Chief Complaint, VITAL SIGNS/ANCILLARY NOTES, Brief Assessment, Lab Results, Radiology Results, Assessment/Plan   Last Updated: 19-Jul-15 15:29 by Loistine Simas (MD)

## 2014-10-20 NOTE — Consult Note (Signed)
PATIENT NAME:  Debra Stone, Debra Stone MR#:  [id number] DATE OF BIRTH:  [birth year]  DATE OF CONSULTATION:  01/13/2014  CONSULTING PHYSICIAN:  Christena Deem, MD  This is a redictation of [phone].  That dictation apparently was lost somewhere during the process.   REASON FOR CONSULTATION: Lower abdominal pain and hematochezia.   HISTORY OF PRESENT ILLNESS: [name] is a 54 year old Caucasian female who states that she was in her usual state of health until yesterday afternoon. At that time, she began to develop some lower abdominal pain. She states that she has problems with chronic back pain and the back pain got worse as well. She came to the Emergency Room yesterday evening mainly with a complaint of back pain. During her stay in the Emergency Room, she began having rectal bleeding and increased lower abdominal pain. She was subsequently admitted to the hospital for further evaluation and treatment.   The patient states that she has had no previous similar pain. The pain seemed to radiate from the lower abdomen to her back. There has been no nausea or vomiting. She has never had any rectal bleeding. She has no history of peptic ulcer disease. She has been taking NSAIDs in the past and was apparently prescribed some meloxicam, which she was taking up until just perhaps last week or so. She occasionally takes an aspirin. Although this mini dose aspirin was prescribed due to her cardiac history, she takes it irregularly. She has never had an upper EGD or colonoscopy. However, her history in the computer relates a history of Barrett's esophagus. Review of this shows an EGD being done on 04/14/2006 for nausea with vomiting with finding of the appearance of Barrett's esophagus. Review of the pathology report from biopsies that were done; however, did not confirm Barrett's esophagus.  There was esophagitis present and indication of reflux-related esophagitis. When she was in the Emergency Room and began  passing the blood, she became quite hypotensive. She was transfused 2 units of blood at the time in the Emergency Room. She is not on pressors currently and she has been stable since then.   PAST MEDICAL HISTORY: 1.  Hypertension.  2.  Dyslipidemia. 3.  Type 2 diabetes. 4. Coronary artery disease, status post coronary stenting, approximately 5 years ago at [facility].  EF 70%.  5.  Depression.  6.  Anemia. Endoscopic appearance of Barrett's esophagus not verified by biopsy.  7.  Lower extremity claudication.  8.  Questionable restless leg syndrome.  9.  Former alcohol abuse.  10.  Bipolar disorder.  11.  Anxiety.  12.  Ongoing tobacco abuse.   REVIEW OF SYSTEMS:  Ten systems reviewed per admission history and physical, agree with same.   PHYSICAL EXAMINATION: VITAL SIGNS: Temperature 98.8, pulse 105-118, blood pressure 142/89-121/68, pulse oximetry 98% to 96%.  GENERAL: She is a 54 year old Caucasian female in complaining of abdominal pain.  HEENT: Normocephalic, atraumatic.  Complexion is ruddy. Eyes are anicteric.  OROPHARYNX: No lesions.  NECK: No JVD.  HEART: Mild tachycardia without rub or gallop.  LUNGS: Bilaterally clear.  ABDOMEN: Soft. There is minimal discomfort to palpation; this mostly in the left lower quadrant. The patient is complaining of more pain than there is noted on physical examination.  RECTAL: Anorectal exam is deferred. There is a rectal tube in place. This draining a medium to dark brown effluent that is blood tinged, but not grossly melenic. There is no rebound.  EXTREMITIES: No clubbing, cyanosis, or edema.  NEUROLOGICAL:  Cranial nerves II through XII grossly intact. Muscle strength bilaterally equal and symmetric.   LABORATORY, DIAGNOSTIC, AND RADIOLOGICAL DATA: On admission to the hospital early this morning, she had a glucose of 229, BUN 19, creatinine 1.69, sodium 133, potassium 3.2, chloride 102, bicarbonate 17, lipase 415. Hepatic profile showing  a total protein of 8.3, albumin 3.8, total bilirubin 0.4, alkaline phosphatase 133, AST 50, ALT 22. Cardiac enzymes x1 normal with a troponin less than 0.02. Hemogram last night showing white count of 25.2, H and H 16.3/50.9, platelet count of 386,000. Two units noted to be transfused as above. She has a pro time of 17.8. INR of 1.5. Repeat laboratories today show a glucose of 162, BUN 22, creatinine of 1.54, sodium 139, potassium 4.8, chloride 112, calcium 7.0. Repeat hemoglobin of 14.2 after one earlier this morning of 14.0. These were separated by 8 hours.   She had a CT scan of the abdomen and pelvis after I examined her, this showing diffuse colitis involving the left colon which may be "infectious or inflammatory" in nature. Trace amount of associated ascites, as well as inflammatory stranding in the adjacent fat. No abscess or extraluminal air. There was proximal small bowel dilatation, mild with some air-fluid levels in the central small bowel being decompressed, with the more distal ileum being more normal in caliber. Findings reflecting possibly a partial small bowel obstruction or adynamic ileus. No other acute findings.   I did call and talk to the radiologist about this film, questioning patency of the mesenteric takeoffs. These appeared to be normal. There is some plaquing adjacent to the origin of the inferior mesenteric, but adequate flow beyond. There was GI blood loss study done that showed no acute bleeding and a portable chest film for IJ line placement showed lungs mildly hypoexpanded and minimal bilateral atelectasis.   ASSESSMENT: Acute onset of abdominal pain, as well as hematochezia. Currently, there seems to be no acute blood loss ongoing. She has a negative bleeding scan. Her CT scan is consistent with colitis; however, my impression clinically, would be that of ischemic colitis; this being consistent with her left-sided CT scan result. The patient continues to complain of abdominal  pain; however, is stating over and over again that she wants to have a cigarette and if she cannot have one, she is going to go home. The patient is currently hemodynamically stable.   RECOMMENDATIONS:  Continue IV hydration, and antibiotics as you are. Continue pantoprazole. Will continue to follow with you. I will plan for a flexible sigmoidoscopy on Monday. Further recommendations to follow.   ____________________________ Christena DeemMartin U. Raunel Dimartino, MD mus:ds D: 01/13/2014 18:37:21 ET T: 01/13/2014 18:47:25 ET JOB#: [id number]  cc: Christena DeemMartin U. Dreama Kuna, MD, <Dictator> Christena DeemMARTIN U Seth Friedlander MD ELECTRONICALLY SIGNED 01/25/2014 17:39 Christena DeemMARTIN U Ivaan Liddy MD ELECTRONICALLY SIGNED 01/25/2014 17:44

## 2015-09-30 IMAGING — CT CT CERVICAL SPINE WITHOUT CONTRAST
4 of 5 series · 16 of 33 positions shown, 18 images · non-contrast
Comparison: None.

CLINICAL DATA: Syncope, confused, fall

EXAM:
CT HEAD WITHOUT CONTRAST
CT CERVICAL SPINE WITHOUT CONTRAST
TECHNIQUE: Multidetector CT imaging of the head and cervical spine was
performed following the standard protocol without intravenous
contrast. Multiplanar CT image reconstructions of the cervical spine
were also generated.

[Series 5: c spine soft · axial · 0.31mm/px · z∈[-296,-202]mm · 4 of 79 slices shown]
[im 16/79  soft-tissue]
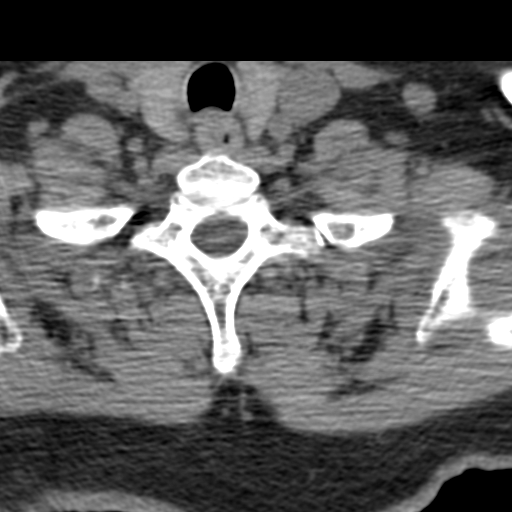
[im 32/79  soft-tissue]
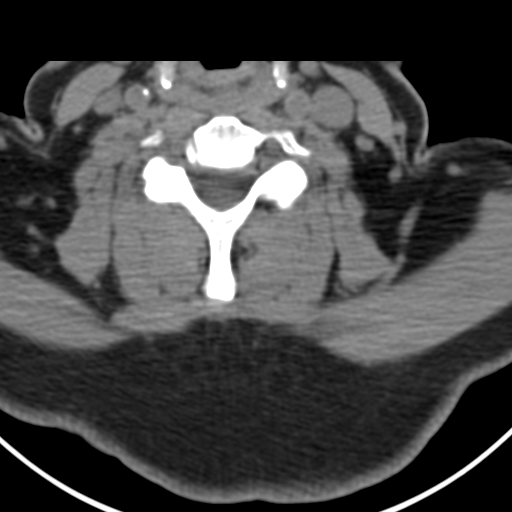
[im 47/79  soft-tissue]
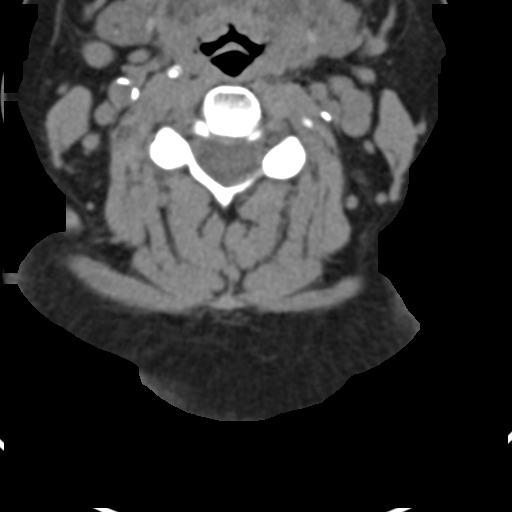
[im 63/79  soft-tissue]
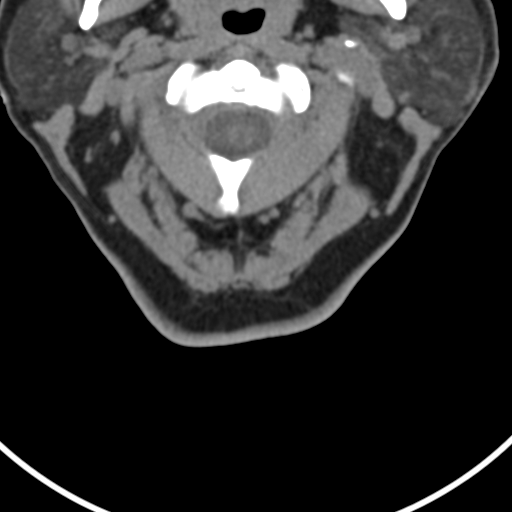

[Series 602: ortho · axial · 0.31mm/px · z∈[-319,-227]mm · 4 of 84 slices shown, 5 images]
[im 17/84  soft-tissue]
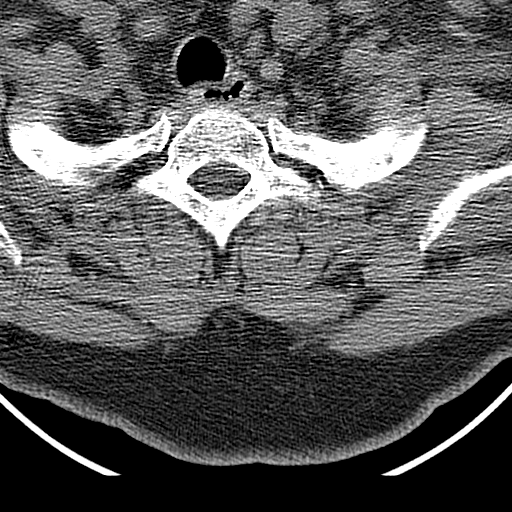
[im 17/84  bone]
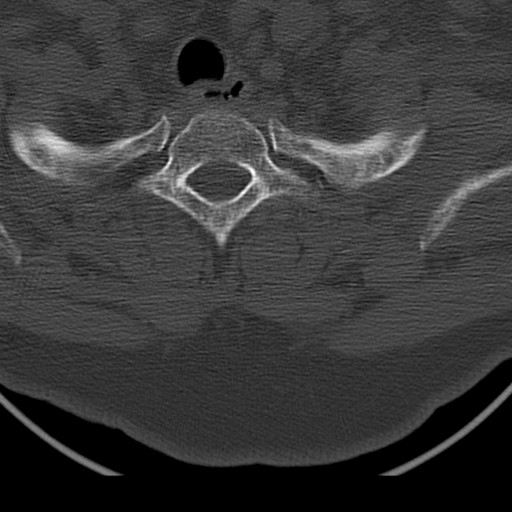
[im 34/84  bone]
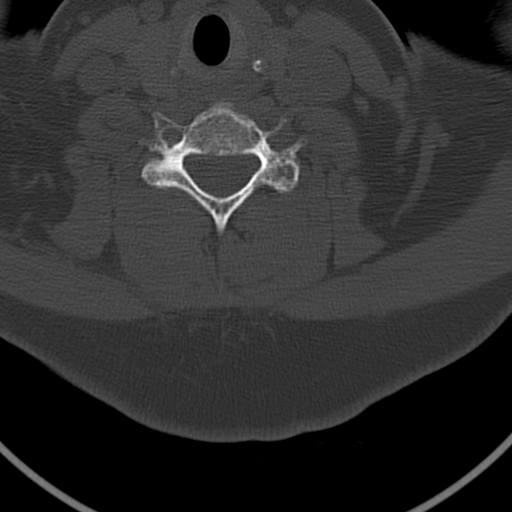
[im 50/84  bone]
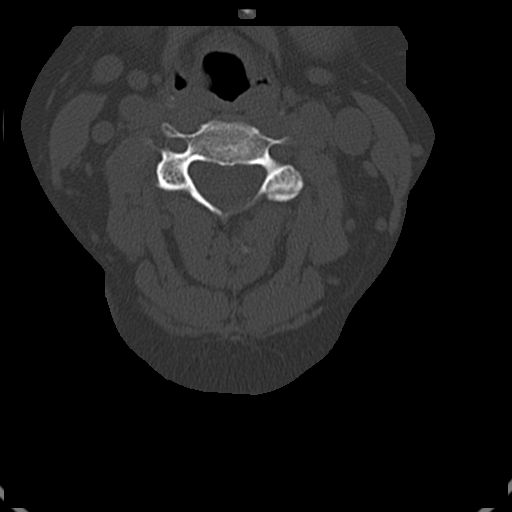
[im 67/84  bone]
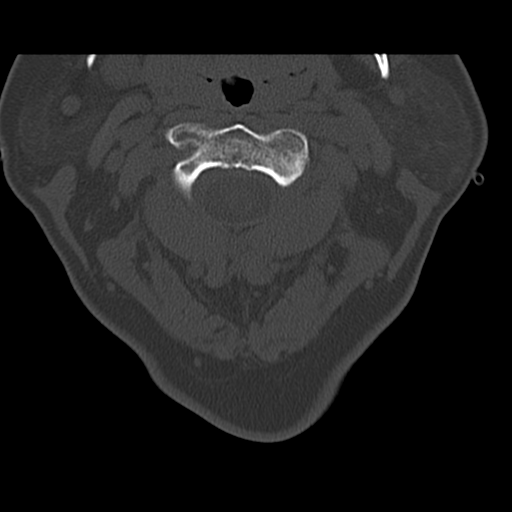

[Series 603: cor · coronal · 0.31mm/px · 3 of 36 slices shown]
[im 8/36  bone]
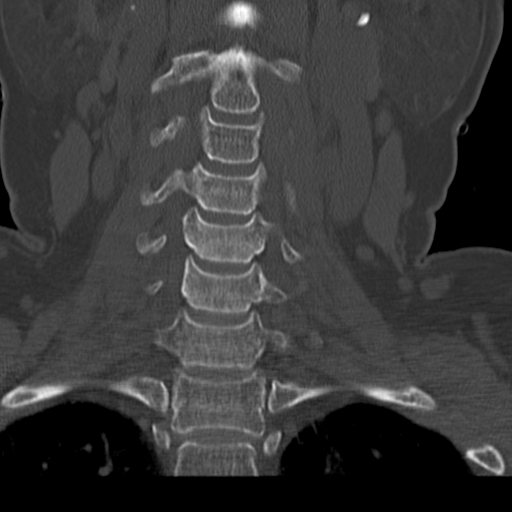
[im 15/36  bone]
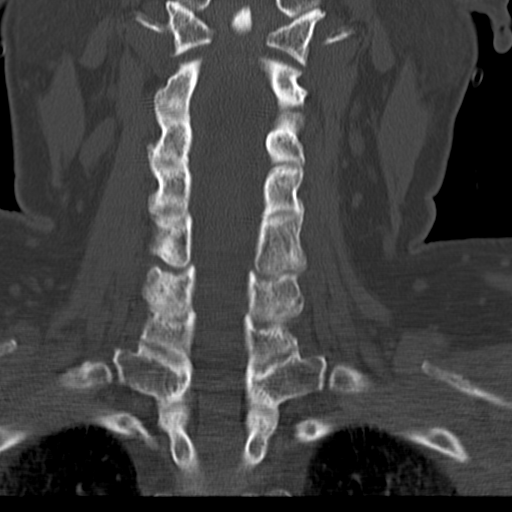
[im 22/36  bone]
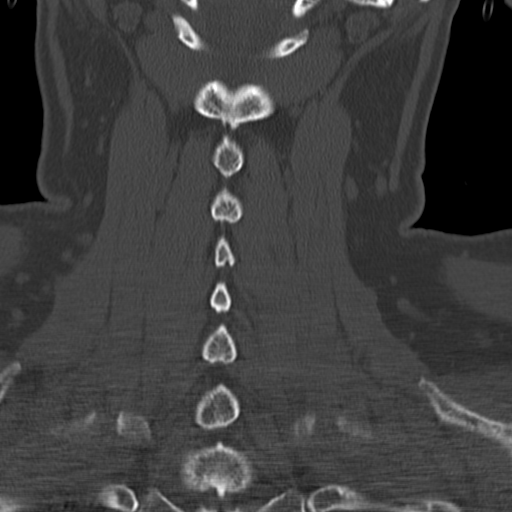

[Series 604: sag · sagittal · 0.31mm/px · 5 of 42 slices shown, 6 images]
[im 14/42  bone]
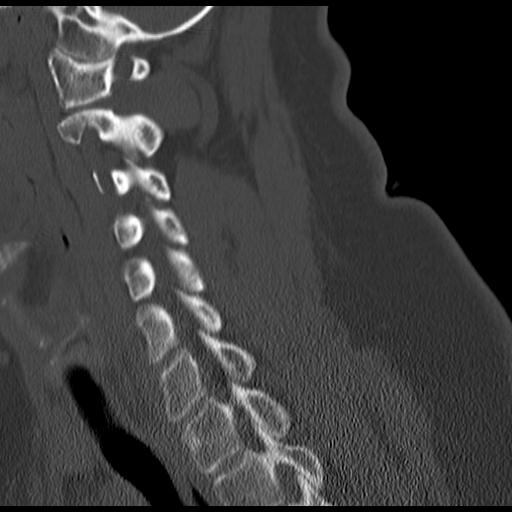
[im 18/42  bone]
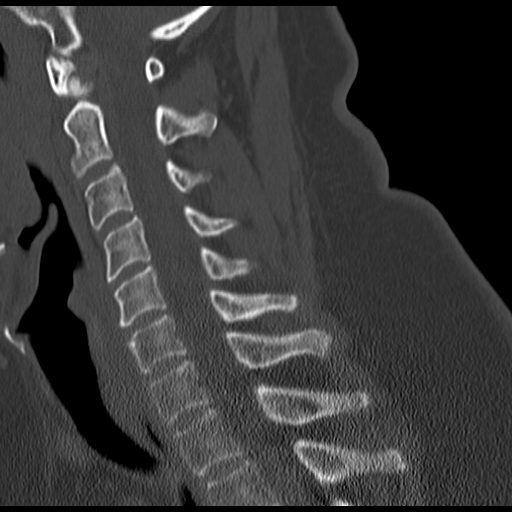
[im 21/42  soft-tissue]
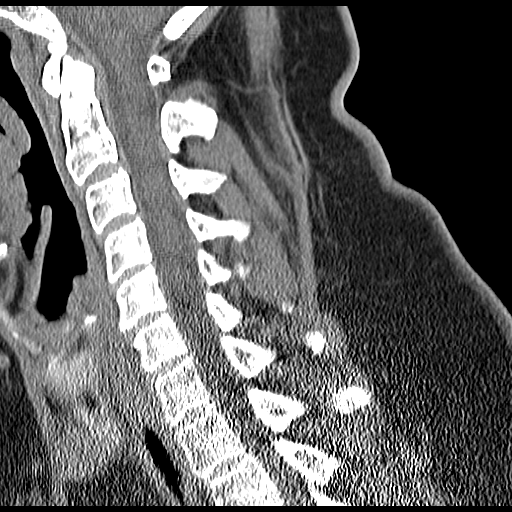
[im 21/42  bone]
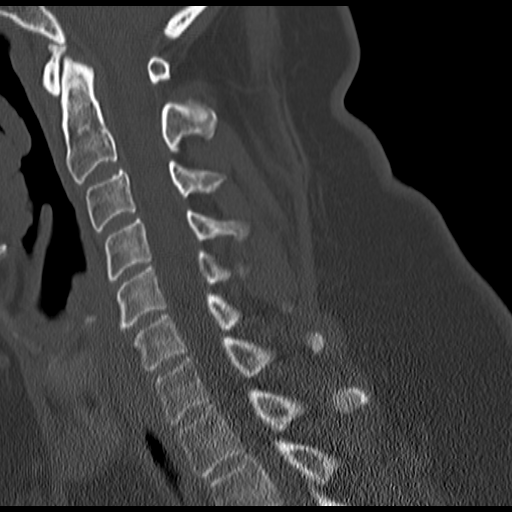
[im 24/42  bone]
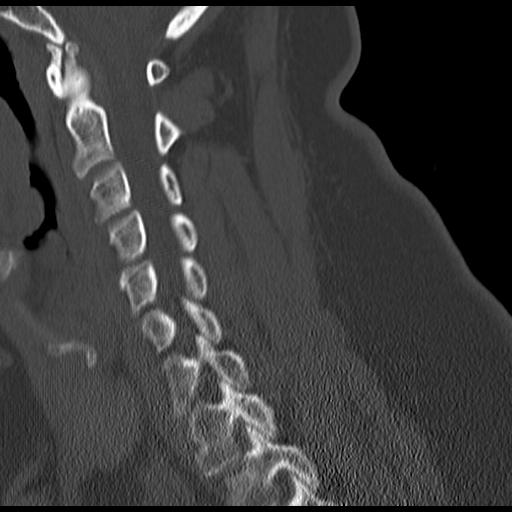
[im 28/42  bone]
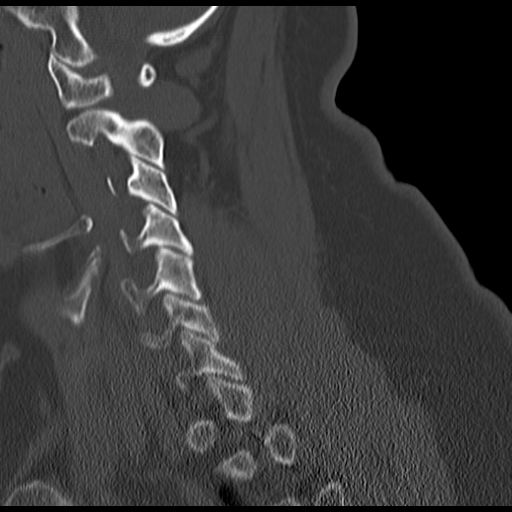

[16 of 33 positions shown; findings below may reference images not displayed]

FINDINGS: CT HEAD FINDINGS

There is no evidence of mass effect, midline shift or extra-axial
fluid collections. There is no evidence of a space-occupying lesion
or intracranial hemorrhage. There is no evidence of a cortical-based
area of acute infarction.

The ventricles and sulci are appropriate for the patient's age. The
basal cisterns are patent.

Visualized portions of the orbits are unremarkable. The visualized
portions of the paranasal sinuses and mastoid air cells are
unremarkable.

The osseous structures are unremarkable.

CT CERVICAL SPINE FINDINGS

The alignment is anatomic. The vertebral body heights are
maintained. There is no acute fracture. There is no static
listhesis. The prevertebral soft tissues are normal. The intraspinal
soft tissues are not fully imaged on this examination due to poor
soft tissue contrast, but there is no gross soft tissue abnormality.

The disc spaces are maintained.

The visualized portions of the lung apices demonstrate no focal
abnormality.There is mild bilateral carotid artery atherosclerosis.
IMPRESSION: 1. No acute intracranial pathology.
2. No acute osseous injury of the cervical spine.
